# Patient Record
Sex: Female | Born: 1985 | Race: Black or African American | Hispanic: No | Marital: Single | Smoking: Never smoker
Health system: Southern US, Community
[De-identification: ages and names within clinical notes are randomized; demographics above are authoritative.]

## PROBLEM LIST (undated history)

## (undated) DIAGNOSIS — Z8759 Personal history of other complications of pregnancy, childbirth and the puerperium: Secondary | ICD-10-CM

## (undated) DIAGNOSIS — I10 Essential (primary) hypertension: Secondary | ICD-10-CM

## (undated) DIAGNOSIS — Z8742 Personal history of other diseases of the female genital tract: Secondary | ICD-10-CM

## (undated) DIAGNOSIS — R87629 Unspecified abnormal cytological findings in specimens from vagina: Secondary | ICD-10-CM

## (undated) DIAGNOSIS — B009 Herpesviral infection, unspecified: Secondary | ICD-10-CM

## (undated) HISTORY — PX: NO PAST SURGERIES: SHX2092

## (undated) HISTORY — DX: Essential (primary) hypertension: I10

## (undated) HISTORY — DX: Unspecified abnormal cytological findings in specimens from vagina: R87.629

## (undated) HISTORY — PX: CERVICAL CERCLAGE: SHX1329

---

## 2019-12-24 HISTORY — PX: CERVICAL CERCLAGE: SHX1329

## 2019-12-24 NOTE — L&D Delivery Note (Signed)
OB/GYN Faculty Practice Delivery Note  Patricia Valencia is a 34 y.o. [id number] s/p vaginal delivery at [redacted]w[redacted]d. She was admitted for preeclampsia with severe features based on headache and visual scotomas.   ROM: 2h 37m with clear fluid GBS Status: unknown (GBS culture collected on admission) Maximum Maternal Temperature: 98.38F  Labor Progress: On admission, cytotec was administered and FB was placed. Pt was then transitioned to pitocin with good fetal tolerance. AROM for clear fluid at 0330. Pt then progressed to complete cervical dilation with uncomplicated delivery as noted below.  Delivery Date/Time: 4136 on 11/08/20 Delivery: Called to room and fetal head was visible at introitus. Head delivered LOA. Loose nuchal cord present x1, reduced prior to delivery. Shoulder and body delivered in usual fashion. Infant with spontaneous cry, placed on mother's abdomen, dried and stimulated. Cord clamped x 2 after 1-minute delay, and cut by maternal grandmother under my direct supervision. Cord blood drawn. Placenta delivered spontaneously with gentle cord traction. Fundus firm with massage and Pitocin. Labia, perineum, vagina, and cervix were inspected, no evidence of lacerations.   Placenta: 3-vessel cord, intact, sent to L&D Complications: none Lacerations: none EBL: 50 ml Analgesia: IV fentanyl  Infant: female  APGARs 8 & 9  weight per medical record  Guillermina City, MD OB/GYN Fellow, Faculty Practice

## 2020-04-14 ENCOUNTER — Emergency Department (HOSPITAL_COMMUNITY): Payer: Medicaid Other

## 2020-04-14 ENCOUNTER — Emergency Department (HOSPITAL_COMMUNITY)
Admission: EM | Admit: 2020-04-14 | Discharge: 2020-04-14 | Disposition: A | Discharge status: Home / Self Care | Payer: Medicaid Other | Attending: Emergency Medicine | Admitting: Emergency Medicine

## 2020-04-14 ENCOUNTER — Encounter (HOSPITAL_COMMUNITY): Payer: Self-pay

## 2020-04-14 ENCOUNTER — Other Ambulatory Visit: Payer: Self-pay

## 2020-04-14 DIAGNOSIS — Z3A01 Less than 8 weeks gestation of pregnancy: Secondary | ICD-10-CM | POA: Insufficient documentation

## 2020-04-14 DIAGNOSIS — O468X1 Other antepartum hemorrhage, first trimester: Secondary | ICD-10-CM

## 2020-04-14 DIAGNOSIS — O418X10 Other specified disorders of amniotic fluid and membranes, first trimester, not applicable or unspecified: Secondary | ICD-10-CM

## 2020-04-14 DIAGNOSIS — O418X11 Other specified disorders of amniotic fluid and membranes, first trimester, fetus 1: Secondary | ICD-10-CM | POA: Insufficient documentation

## 2020-04-14 DIAGNOSIS — O039 Complete or unspecified spontaneous abortion without complication: Secondary | ICD-10-CM | POA: Insufficient documentation

## 2020-04-14 DIAGNOSIS — Z2913 Encounter for prophylactic Rho(D) immune globulin: Secondary | ICD-10-CM | POA: Diagnosis not present

## 2020-04-14 DIAGNOSIS — R102 Pelvic and perineal pain: Secondary | ICD-10-CM | POA: Insufficient documentation

## 2020-04-14 DIAGNOSIS — O200 Threatened abortion: Secondary | ICD-10-CM

## 2020-04-14 DIAGNOSIS — O209 Hemorrhage in early pregnancy, unspecified: Secondary | ICD-10-CM | POA: Diagnosis present

## 2020-04-14 LAB — URINALYSIS, ROUTINE W REFLEX MICROSCOPIC
Bilirubin Urine: NEGATIVE
Glucose, UA: NEGATIVE mg/dL
Ketones, ur: NEGATIVE mg/dL
Leukocytes,Ua: NEGATIVE
Nitrite: NEGATIVE
Protein, ur: NEGATIVE mg/dL
RBC / HPF: >50 RBC/hpf — ABNORMAL HIGH (ref 0–5)
Specific Gravity, Urine: 1.013 (ref 1.005–1.030)
pH: 6 (ref 5.0–8.0)

## 2020-04-14 LAB — COMPREHENSIVE METABOLIC PANEL WITH GFR
ALT: 17 U/L (ref 0–44)
AST: 15 U/L (ref 15–41)
Albumin: 3.5 g/dL (ref 3.5–5.0)
Alkaline Phosphatase: 39 U/L (ref 38–126)
Anion gap: 7 (ref 5–15)
BUN: 13 mg/dL (ref 6–20)
CO2: 21 mmol/L — ABNORMAL LOW (ref 22–32)
Calcium: 8.3 mg/dL — ABNORMAL LOW (ref 8.9–10.3)
Chloride: 108 mmol/L (ref 98–111)
Creatinine, Ser: 0.76 mg/dL (ref 0.44–1.00)
GFR calc Af Amer: >60 mL/min
GFR calc non Af Amer: >60 mL/min
Glucose, Bld: 103 mg/dL — ABNORMAL HIGH (ref 70–99)
Potassium: 3.8 mmol/L (ref 3.5–5.1)
Sodium: 136 mmol/L (ref 135–145)
Total Bilirubin: 0.7 mg/dL (ref 0.3–1.2)
Total Protein: 6.5 g/dL (ref 6.5–8.1)

## 2020-04-14 LAB — ABO/RH: ABO/RH(D): A NEG

## 2020-04-14 LAB — CBC WITH DIFFERENTIAL/PLATELET
Abs Immature Granulocytes: 0.04 10*3/uL (ref 0.00–0.07)
Basophils Absolute: 0.1 10*3/uL (ref 0.0–0.1)
Basophils Relative: 1 %
Eosinophils Absolute: 0.4 10*3/uL (ref 0.0–0.5)
Eosinophils Relative: 5 %
HCT: 39.3 % (ref 36.0–46.0)
Hemoglobin: 12.6 g/dL (ref 12.0–15.0)
Immature Granulocytes: 0 %
Lymphocytes Relative: 20 %
Lymphs Abs: 1.9 10*3/uL (ref 0.7–4.0)
MCH: 27.6 pg (ref 26.0–34.0)
MCHC: 32.1 g/dL (ref 30.0–36.0)
MCV: 86 fL (ref 80.0–100.0)
Monocytes Absolute: 0.9 10*3/uL (ref 0.1–1.0)
Monocytes Relative: 9 %
Neutro Abs: 6.3 10*3/uL (ref 1.7–7.7)
Neutrophils Relative %: 65 %
Platelets: 125 10*3/uL — ABNORMAL LOW (ref 150–400)
RBC: 4.57 MIL/uL (ref 3.87–5.11)
RDW: 13 % (ref 11.5–15.5)
WBC: 9.6 10*3/uL (ref 4.0–10.5)
nRBC: 0 % (ref 0.0–0.2)

## 2020-04-14 LAB — WET PREP, GENITAL
Clue Cells Wet Prep HPF POC: NONE SEEN
Sperm: NONE SEEN
Trich, Wet Prep: NONE SEEN
Yeast Wet Prep HPF POC: NONE SEEN

## 2020-04-14 LAB — HIV ANTIBODY (ROUTINE TESTING W REFLEX): HIV Screen 4th Generation wRfx: NONREACTIVE

## 2020-04-14 LAB — RPR: RPR Ser Ql: NONREACTIVE

## 2020-04-14 LAB — HCG, QUANTITATIVE, PREGNANCY: hCG, Beta Chain, Quant, S: 25577 m[IU]/mL — ABNORMAL HIGH

## 2020-04-14 LAB — LIPASE, BLOOD: Lipase: 38 U/L (ref 11–51)

## 2020-04-14 MED ORDER — MORPHINE SULFATE (PF) 4 MG/ML IV SOLN
4.0000 mg | Freq: Once | INTRAVENOUS | Status: AC
  Administered 2020-04-14: 4 mg via INTRAVENOUS
  Filled 2020-04-14: qty 1

## 2020-04-14 MED ORDER — RHO D IMMUNE GLOBULIN 1500 UNIT/2ML IJ SOSY
300.0000 ug | PREFILLED_SYRINGE | Freq: Once | INTRAMUSCULAR | Status: AC
  Administered 2020-04-14: 300 ug via INTRAMUSCULAR
  Filled 2020-04-14: qty 2

## 2020-04-14 NOTE — ED Notes (Signed)
Blood bank said they had rhogan ready for this patient. Notified Lynnsey,RN.

## 2020-04-14 NOTE — ED Triage Notes (Signed)
Pt presents with c/o vaginal bleeding that started this morning. Pt had a positive pregnancy test on 4/20, is unsure of the exact date of her last period but believes it to be around 02/18/20. Pt reports she has been passing some clots this morning. Pt is tearful during the triage.

## 2020-04-14 NOTE — ED Provider Notes (Signed)
[place] DEPT Provider Note   CSN: [id number] Arrival date & time: 04/14/20  [id number]     History Chief Complaint  Patient presents with  . Vaginal Bleeding    Patricia Valencia is a 34 y.o. female.  The history is provided by the patient. No language interpreter was used.     34 year old female currently approximately [redacted] weeks pregnant presenting complaining of vaginal bleeding.  Patient reports she went to use the bathroom this morning and noticed bleeding,, transvaginal.  She also noted some clots.  She also endorsed feeling crampy sensation to her low abdomen and pelvic region.  Pain has been persistent, 6 out of 10, improves with heating pad.  She report last menstrual period was 02/18/2020.  She has had a positive pregnancy test at home.  She reports she is concerned about potential miscarriage.  She denies any increased activities or heavy lifting or anything that may contribute to this.  She felt that she has been undergoing through a lot of stress as she recently lost her baby's father recently prior to the discovery of her pregnancy.  She is a G4, P3 with 3 children.  She has not had a formal ultrasound.   No past medical history on file.  There are no problems to display for this patient.    The histories are not reviewed yet. Please review them in the "History" navigator section and refresh this [place].   OB History   No obstetric history on file.     No family history on file.  Social History   Tobacco Use  . Smoking status: Not on file  Substance Use Topics  . Alcohol use: Not on file  . Drug use: Not on file    Home Medications Prior to Admission medications   Not on File    Allergies    Patient has no allergy information on record.  Review of Systems   Review of Systems  All other systems reviewed and are negative.   Physical Exam Updated Vital Signs BP 126/90   Pulse 79   Temp 98.9 F (37.2 C) (Oral)   Resp  15   LMP 02/18/2020 (Approximate)   SpO2 100%   Physical Exam Vitals and nursing note reviewed.  Constitutional:      General: She is not in acute distress.    Appearance: She is well-developed.     Comments: Patient is tearful.  HENT:     Head: Atraumatic.  Eyes:     Conjunctiva/sclera: Conjunctivae normal.  Cardiovascular:     Rate and Rhythm: Normal rate and regular rhythm.     Heart sounds: Normal heart sounds.  Pulmonary:     Effort: Pulmonary effort is normal.     Breath sounds: Normal breath sounds.  Abdominal:     Palpations: Abdomen is soft.     Tenderness: There is no abdominal tenderness.  Genitourinary:    Comments: Chaperone present during exam.  No inguinal lymphadenopathy or inguinal hernia noted.  Normal external genitalia.  Discomfort with speculum insertion.  Copious amount of blood noted in vaginal vault with possible products of conception.  Cervical os is closed.  On bimanual examination, left adnexal tenderness.  No cervical motion tenderness. Musculoskeletal:     Cervical back: Neck supple.  Skin:    Findings: No rash.  Neurological:     Mental Status: She is alert.     ED Results / Procedures / Treatments   Labs (all labs ordered are  listed, but only abnormal results are displayed) Labs Reviewed  WET PREP, GENITAL - Abnormal; Notable for the following components:      Result Value   WBC, Wet Prep HPF POC FEW (*)    All other components within normal limits  CBC WITH DIFFERENTIAL/PLATELET - Abnormal; Notable for the following components:   Platelets 125 (*)    All other components within normal limits  COMPREHENSIVE METABOLIC PANEL - Abnormal; Notable for the following components:   CO2 21 (*)    Glucose, Bld 103 (*)    Calcium 8.3 (*)    All other components within normal limits  URINALYSIS, ROUTINE W REFLEX MICROSCOPIC - Abnormal; Notable for the following components:   Hgb urine dipstick LARGE (*)    RBC / HPF >50 (*)    Bacteria, UA RARE  (*)    All other components within normal limits  HCG, QUANTITATIVE, PREGNANCY - Abnormal; Notable for the following components:   hCG, Beta Chain, Quant, S 25,577 (*)    All other components within normal limits  LIPASE, BLOOD  RPR  HIV ANTIBODY (ROUTINE TESTING W REFLEX)  ABO/RH  RH IG WORKUP (INCLUDES ABO/RH)  GC/CHLAMYDIA PROBE AMP ([place]) NOT AT [facility]    EKG None  Radiology US OB Comp < 14 Wks  Result Date: 04/14/2020 CLINICAL DATA:  Pregnant patient with vaginal bleeding. EXAM: OBSTETRIC <14 WK [place] AND TRANSVAGINAL OB US TECHNIQUE: Both transabdominal and transvaginal ultrasound examinations were performed for complete evaluation of the gestation as well as the maternal uterus, adnexal regions, and pelvic cul-de-sac. Transvaginal technique was performed to assess early pregnancy. COMPARISON:  None. FINDINGS: Intrauterine gestational sac: Single visualized. Yolk sac:  Visualized. Embryo:  Visualized. Cardiac Activity: Detected. Heart Rate: 89 bpm CRL:  2.2 mm   5 w   5 d                  [place] EDC: 12/10/2020. Subchorionic hemorrhage: Moderately large to large. Maternal uterus/adnexae: Corpus luteum cyst on the right noted. Left ovary not visualized. Small volume of free pelvic fluid is noted. IMPRESSION: Single living intrauterine pregnancy. Moderately large to large subchorionic hemorrhage. Electronically Signed   By: Inge Rise M.D.   On: 04/14/2020 09:50   US OB Transvaginal  Result Date: 04/14/2020 CLINICAL DATA:  Pregnant patient with vaginal bleeding. EXAM: OBSTETRIC <14 WK [place] AND TRANSVAGINAL OB US TECHNIQUE: Both transabdominal and transvaginal ultrasound examinations were performed for complete evaluation of the gestation as well as the maternal uterus, adnexal regions, and pelvic cul-de-sac. Transvaginal technique was performed to assess early pregnancy. COMPARISON:  None. FINDINGS: Intrauterine gestational sac: Single visualized. Yolk sac:  Visualized. Embryo:   Visualized. Cardiac Activity: Detected. Heart Rate: 89 bpm CRL:  2.2 mm   5 w   5 d                  [place] EDC: 12/10/2020. Subchorionic hemorrhage: Moderately large to large. Maternal uterus/adnexae: Corpus luteum cyst on the right noted. Left ovary not visualized. Small volume of free pelvic fluid is noted. IMPRESSION: Single living intrauterine pregnancy. Moderately large to large subchorionic hemorrhage. Electronically Signed   By: Inge Rise M.D.   On: 04/14/2020 09:50    Procedures Procedures (including critical care time)  Medications Ordered in ED Medications  morphine 4 MG/ML injection 4 mg (4 mg Intravenous Given 04/14/20 0919)  rho (d) immune globulin (RHIG/RHOPHYLAC) injection 300 mcg (300 mcg Intramuscular Given 04/14/20 0950)  ED Course  I have reviewed the triage vital signs and the nursing notes.  Pertinent labs & imaging results that were available during my care of the patient were reviewed by me and considered in my medical decision making (see chart for details).    MDM Rules/Calculators/A&P                      BP 123/83 (BP Location: Left Arm)   Pulse 81   Temp 98.9 F (37.2 C) (Oral)   Resp 18   LMP 02/18/2020 (Approximate)   SpO2 98%   Final Clinical Impression(s) / ED Diagnoses Final diagnoses:  Threatened miscarriage  Subchorionic hemorrhage of placenta in first trimester, single or unspecified fetus    Rx / DC Orders ED Discharge Orders    None     8:11 AM Patient here with vaginal bleeding in first trimester.  Findings concerning for threatened miscarriage versus active spontaneous abortion.  Work-up initiated.  8:40 AM Pt is Rh negative, will start RhoGam.   10:08 AM UA without signs of urinary tract infection, wet prep unremarkable, labs are reassuring, beta-hCG is 25,000, transvaginal ultrasound demonstrating a single living intrauterine pregnancy estimated to be 5 weeks and 5 days with moderate to large subchorionic hemorrhage.  At  this time, patient will be discharged home to follow-up closely with OB for further management of her pregnancy.  Encourage patient to avoid heavy lifting or strength activities in the meantime.   Domenic Moras, PA-C 04/14/20 1013    Veryl Speak, MD 04/14/20 [phone]

## 2020-04-15 LAB — RH IG WORKUP (INCLUDES ABO/RH)
ABO/RH(D): A NEG
Antibody Screen: NEGATIVE
Gestational Age(Wks): 7
Unit division: 0
Unit tag comment: 7

## 2020-04-17 LAB — GC/CHLAMYDIA PROBE AMP (~~LOC~~) NOT AT ARMC
Chlamydia: NEGATIVE
Comment: NEGATIVE
Comment: NORMAL
Neisseria Gonorrhea: NEGATIVE

## 2020-04-23 ENCOUNTER — Emergency Department (HOSPITAL_COMMUNITY): Payer: Medicaid Other

## 2020-04-23 ENCOUNTER — Emergency Department (HOSPITAL_COMMUNITY)
Admission: EM | Admit: 2020-04-23 | Discharge: 2020-04-25 | Disposition: A | Discharge status: Other Acute Inpatient Hospital | Payer: Medicaid Other | Attending: Emergency Medicine | Admitting: Emergency Medicine

## 2020-04-23 ENCOUNTER — Encounter (HOSPITAL_COMMUNITY): Payer: Self-pay | Admitting: *Deleted

## 2020-04-23 ENCOUNTER — Other Ambulatory Visit: Payer: Self-pay

## 2020-04-23 DIAGNOSIS — T50992A Poisoning by other drugs, medicaments and biological substances, intentional self-harm, initial encounter: Secondary | ICD-10-CM | POA: Insufficient documentation

## 2020-04-23 DIAGNOSIS — F322 Major depressive disorder, single episode, severe without psychotic features: Secondary | ICD-10-CM | POA: Diagnosis not present

## 2020-04-23 DIAGNOSIS — O9A211 Injury, poisoning and certain other consequences of external causes complicating pregnancy, first trimester: Secondary | ICD-10-CM | POA: Diagnosis present

## 2020-04-23 DIAGNOSIS — Z79899 Other long term (current) drug therapy: Secondary | ICD-10-CM | POA: Insufficient documentation

## 2020-04-23 DIAGNOSIS — Z3491 Encounter for supervision of normal pregnancy, unspecified, first trimester: Secondary | ICD-10-CM

## 2020-04-23 DIAGNOSIS — Z20822 Contact with and (suspected) exposure to covid-19: Secondary | ICD-10-CM | POA: Diagnosis not present

## 2020-04-23 DIAGNOSIS — R112 Nausea with vomiting, unspecified: Secondary | ICD-10-CM | POA: Insufficient documentation

## 2020-04-23 DIAGNOSIS — N939 Abnormal uterine and vaginal bleeding, unspecified: Secondary | ICD-10-CM

## 2020-04-23 DIAGNOSIS — T50902A Poisoning by unspecified drugs, medicaments and biological substances, intentional self-harm, initial encounter: Secondary | ICD-10-CM

## 2020-04-23 LAB — CBC
HCT: 34 % — ABNORMAL LOW (ref 36.0–46.0)
Hemoglobin: 11.3 g/dL — ABNORMAL LOW (ref 12.0–15.0)
MCH: 28.1 pg (ref 26.0–34.0)
MCHC: 33.2 g/dL (ref 30.0–36.0)
MCV: 84.6 fL (ref 80.0–100.0)
Platelets: 132 10*3/uL — ABNORMAL LOW (ref 150–400)
RBC: 4.02 MIL/uL (ref 3.87–5.11)
RDW: 12.9 % (ref 11.5–15.5)
WBC: 10.8 10*3/uL — ABNORMAL HIGH (ref 4.0–10.5)
nRBC: 0 % (ref 0.0–0.2)

## 2020-04-23 LAB — RAPID URINE DRUG SCREEN, HOSP PERFORMED
Amphetamines: NOT DETECTED
Barbiturates: NOT DETECTED
Benzodiazepines: NOT DETECTED
Cocaine: NOT DETECTED
Opiates: NOT DETECTED
Tetrahydrocannabinol: NOT DETECTED

## 2020-04-23 LAB — COMPREHENSIVE METABOLIC PANEL
ALT: 15 U/L (ref 0–44)
AST: 20 U/L (ref 15–41)
Albumin: 3.5 g/dL (ref 3.5–5.0)
Alkaline Phosphatase: 39 U/L (ref 38–126)
Anion gap: 10 (ref 5–15)
BUN: <5 mg/dL — ABNORMAL LOW (ref 6–20)
CO2: 22 mmol/L (ref 22–32)
Calcium: 8.9 mg/dL (ref 8.9–10.3)
Chloride: 104 mmol/L (ref 98–111)
Creatinine, Ser: 0.87 mg/dL (ref 0.44–1.00)
GFR calc Af Amer: >60 mL/min (ref >60)
GFR calc non Af Amer: >60 mL/min (ref >60)
Glucose, Bld: 104 mg/dL — ABNORMAL HIGH (ref 70–99)
Potassium: 3.7 mmol/L (ref 3.5–5.1)
Sodium: 136 mmol/L (ref 135–145)
Total Bilirubin: 0.5 mg/dL (ref 0.3–1.2)
Total Protein: 6.5 g/dL (ref 6.5–8.1)

## 2020-04-23 LAB — ETHANOL: Alcohol, Ethyl (B): <10 mg/dL (ref <10)

## 2020-04-23 LAB — ACETAMINOPHEN LEVEL: Acetaminophen (Tylenol), Serum: <10 ug/mL — ABNORMAL LOW (ref 10–30)

## 2020-04-23 LAB — HCG, QUANTITATIVE, PREGNANCY: hCG, Beta Chain, Quant, S: 71618 m[IU]/mL — ABNORMAL HIGH (ref <5)

## 2020-04-23 LAB — SALICYLATE LEVEL: Salicylate Lvl: <7 mg/dL — ABNORMAL LOW (ref 7.0–30.0)

## 2020-04-23 MED ORDER — RHO D IMMUNE GLOBULIN 1500 UNIT/2ML IJ SOSY: 300.0000 ug | PREFILLED_SYRINGE | Freq: Once | INTRAMUSCULAR | Status: DC

## 2020-04-23 NOTE — BH Assessment (Signed)
Tele Assessment Note   Patient Name: Patricia Valencia MRN: [id number] Referring Physician: Lajean Saver, MD Location of Patient: [facility] Location of Provider: [place] is an 34 y.o. female who presents to the ED voluntarily. Pt reports she intentionally ingested 15-25 various pills. Pt denies that this was a suicide attempt. Pt states she did this because she was feeling overwhelmed and wanted to rest. Pt states she is aware that ingesting that much medication could hurt her. Pt is [redacted] weeks pregnant. Pt denies that she was trying to hurt herself or her baby but admits she knew that it was possible to cause harm to herself by taking the pills. Pt states she has had multiple stressors including her boyfriend dying 2 weeks ago, work stress, being behind on bills, her other 3 children, and finding out that she is pregnant. Pt denies SI, HI, and AVH. Pt states she has been admitted to an inpt facility 10 years ago but does not disclose the reason. Pt states she does not want to talk about it. Pt states she does not have a current [place] provider but reports she saw a therapist "years ago" for depression.   TTS spoke with the pt's sister, Patricia Valencia with the pt's consent. Pt's sister reports the pt told her she was feeling stressed and "acted impulsively, without thinking." Pt's sister reports she lives in the same apartment complex with her and she has no concerns that she will try to harm herself. Pt's sister reports the pt "has been dealing with a lot" and she states she will be able to keep her safe if she is d/c. Pt's sister states "we all have snap judgment at times."  TTS consulted with Lindon Romp, NP who recommends inpt tx. TTS to seek placement. Pt's nurse Chrisco, Altha Harm, RN has been advised.  Diagnosis: MDD, single episode, severe, w/o psychosis  Past Medical History: History reviewed. No pertinent past medical history.  History reviewed. No pertinent  surgical history.  Family History: History reviewed. No pertinent family history.  Social History:  reports that she has never smoked. She has never used smokeless tobacco. She reports that she does not drink alcohol. No history on file for drug.  Additional Social History:  Alcohol / Drug Use Pain Medications: See MAR Prescriptions: See MAR Over the Counter: See MAR History of alcohol / drug use?: No history of alcohol / drug abuse  CIWA: CIWA-Ar BP: 122/87 Pulse Rate: 86 COWS:    Allergies:  Allergies  Allergen Reactions  . Sulfa Antibiotics Swelling    Facial swelling    Home Medications: (Not in a hospital admission)   OB/GYN Status:  Patient's last menstrual period was 02/18/2020 (approximate).  General Assessment Data Assessment unable to be completed: Yes Reason for not completing assessment: TTS calling cart, no answer Location of Assessment: [facility] ED TTS Assessment: In system Is this a Tele or Face-to-Face Assessment?: Tele Assessment Is this an Initial Assessment or a Re-assessment for this encounter?: Initial Assessment Patient Accompanied by:: N/A Language Other than English: No Living Arrangements: Other (Comment) What gender do you identify as?: Female Marital status: Single Pregnancy Status: Yes (Comment: include estimated delivery date)(approx 8-9 weeks) Living Arrangements: Children Can pt return to current living arrangement?: Yes Admission Status: Voluntary Is patient capable of signing voluntary admission?: Yes Referral Source: Self/Family/Friend Insurance type: MCD     Crisis Care Plan Living Arrangements: Children Name of Psychiatrist: none Name of Therapist: none  Education Status  Is patient currently in school?: No Is the patient employed, unemployed or receiving disability?: Employed  Risk to self with the past 6 months Suicidal Ideation: No-Not Currently/Within Last 6 Months Has patient been a risk to self within the past 6 months  prior to admission? : Yes Suicidal Intent: No Has patient had any suicidal intent within the past 6 months prior to admission? : No Is patient at risk for suicide?: Yes Suicidal Plan?: No Has patient had any suicidal plan within the past 6 months prior to admission? : No Access to Means: No What has been your use of drugs/alcohol within the last 12 months?: denies Previous Attempts/Gestures: No Triggers for Past Attempts: None known Intentional Self Injurious Behavior: None Family Suicide History: No Recent stressful life event(s): Loss (Comment), Other (Comment), Financial Problems(work stress, boyfriend died) Persecutory voices/beliefs?: No Depression: Yes Depression Symptoms: Despondent, Insomnia, Tearfulness, Loss of interest in usual pleasures, Feeling worthless/self pity Substance abuse history and/or treatment for substance abuse?: No Suicide prevention information given to non-admitted patients: Not applicable  Risk to Others within the past 6 months Homicidal Ideation: No Does patient have any lifetime risk of violence toward others beyond the six months prior to admission? : No Thoughts of Harm to Others: No Current Homicidal Intent: No Current Homicidal Plan: No Access to Homicidal Means: No History of harm to others?: No Assessment of Violence: None Noted Does patient have access to weapons?: No Criminal Charges Pending?: No Does patient have a court date: No Is patient on probation?: No  Psychosis Hallucinations: None noted Delusions: None noted  Mental Status Report Appearance/Hygiene: Unremarkable Eye Contact: Good Motor Activity: Freedom of movement Speech: Soft Level of Consciousness: Quiet/awake Mood: Depressed, Sad, Helpless Affect: Depressed, Flat Anxiety Level: None Thought Processes: Coherent, Relevant Judgement: Impaired Orientation: Person, Time, Place, Situation, Appropriate for developmental age Obsessive Compulsive Thoughts/Behaviors:  None  Cognitive Functioning Concentration: Normal Memory: Remote Intact, Recent Intact Is patient IDD: No Insight: Poor Impulse Control: Poor Appetite: Poor Have you had any weight changes? : No Change Sleep: Decreased Total Hours of Sleep: 4 Vegetative Symptoms: None  ADLScreening [facility] Assessment Services) Patient's cognitive ability adequate to safely complete daily activities?: Yes Patient able to express need for assistance with ADLs?: Yes Independently performs ADLs?: Yes (appropriate for developmental age)  Prior Inpatient Therapy Prior Inpatient Therapy: Yes Prior Therapy Dates: 2011 Prior Therapy Facilty/Provider(s): unk Reason for Treatment: pt does not disclose  Prior Outpatient Therapy Prior Outpatient Therapy: Yes Prior Therapy Dates: "years ago" Prior Therapy Facilty/Provider(s): unk Reason for Treatment: depression Does patient have an ACCT team?: No Does patient have Intensive In-House Services?  : No Does patient have [facility] services? : No Does patient have P4CC services?: No  ADL Screening (condition at time of admission) Patient's cognitive ability adequate to safely complete daily activities?: Yes Is the patient deaf or have difficulty hearing?: No Does the patient have difficulty seeing, even when wearing glasses/contacts?: No Does the patient have difficulty concentrating, remembering, or making decisions?: No Patient able to express need for assistance with ADLs?: Yes Does the patient have difficulty dressing or bathing?: No Independently performs ADLs?: Yes (appropriate for developmental age) Does the patient have difficulty walking or climbing stairs?: No Weakness of Legs: None Weakness of Arms/Hands: None  Home Assistive Devices/Equipment Home Assistive Devices/Equipment: None    Abuse/Neglect Assessment (Assessment to be complete while patient is alone) Abuse/Neglect Assessment Can Be Completed: Yes Physical Abuse: Denies Verbal Abuse:  Denies Sexual Abuse: Denies Exploitation of  patient/patient's resources: Denies Self-Neglect: Denies     Regulatory affairs officer (For Healthcare) Does Patient Have a Medical Advance Directive?: No Would patient like information on creating a medical advance directive?: No - Patient declined          Disposition: TTS consulted with Lindon Romp, NP who recommends inpt tx. TTS to seek placement. Pt's nurse Chrisco, Altha Harm, RN has been advised. Disposition Initial Assessment Completed for this Encounter: Yes Disposition of Patient: Admit Type of inpatient treatment program: Adult Patient refused recommended treatment: No  This service was provided via telemedicine using a 2-way, interactive audio and video technology.  Names of all persons participating in this telemedicine service and their role in this encounter. Name: Patricia Valencia Role: Patient  Name: Patricia Valencia Role: Sister  Name: Lind Covert, [place] Role: TTS  Name: Lindon Romp, NP Role: [facility] provider    Lyanne Co 04/23/2020 10:33 PM

## 2020-04-23 NOTE — Progress Notes (Signed)
TTS consulted with Lindon Romp, NP who recommends inpt tx. TTS to seek placement. Pt's nurse Chrisco, Altha Harm, RN has been advised.    TTS attempted to advise the EDP but  Altha Harm, RN could not locate them. TTS called main # [phone] and requested to speak with the EDP to provide the disposition but was connected to a line that went to v/m.

## 2020-04-23 NOTE — ED Provider Notes (Addendum)
[facility] EMERGENCY DEPARTMENT Provider Note   CSN: [id number] Arrival date & time: 04/23/20  1702     History Chief Complaint  Patient presents with  . Ingestion    Patricia Valencia is a 34 y.o. female.  Patient presents s/p ingestion ~15-25 ibuprofen/pm tablets around 3 pm today. Symptoms acute onset, mod-severe, episodic. Denies other ingestion. Patient indicated was under a lot of stress lately, and was also stressed by recent possible miscarriage. Patient with recent ED visit 1 week ago with vaginal bleeding - had ~ 6 week pregnancy on u/s then. Since then did have mild cramping, and bleeding - although notes no abd or pelvic pain. Mild bleeding persists, but improved from earlier visit. No faintness or dizziness. Post ingestion, onset of several episodes nv, but ems notes no pills/pill fragment in emesis. Notes cbg normal. bp normal.   The history is provided by the patient and the EMS personnel. The history is limited by the condition of the patient.       History reviewed. No pertinent past medical history.  There are no problems to display for this patient.   History reviewed. No pertinent surgical history.   OB History    Gravida  1   Para      Term      Preterm      AB      Living        SAB      TAB      Ectopic      Multiple      Live Births              History reviewed. No pertinent family history.  Social History   Tobacco Use  . Smoking status: Never Smoker  . Smokeless tobacco: Never Used  Substance Use Topics  . Alcohol use: Never  . Drug use: Not on file    Home Medications Prior to Admission medications   Medication Sig Start Date End Date Taking? Authorizing Provider  diphenhydramine-acetaminophen (TYLENOL PM) 25-500 MG TABS tablet Take 2 tablets by mouth at bedtime as needed (pain/sleep).   Yes [provider]  Ibuprofen-diphenhydrAMINE Cit (ADVIL PM PO) Take 25 tablets by mouth once.   Yes  [provider]  Multiple Vitamin (MULTIVITAMIN WITH MINERALS) TABS tablet Take 1 tablet by mouth daily.   Yes [provider]    Allergies    Sulfa antibiotics  Review of Systems   Review of Systems  Constitutional: Negative for fever.  HENT: Negative for sore throat.   Eyes: Negative for redness.  Respiratory: Negative for shortness of breath.   Cardiovascular: Negative for chest pain.  Gastrointestinal: Positive for nausea and vomiting. Negative for abdominal pain.  Genitourinary: Negative for dysuria and flank pain.       Recent vaginal bleeding/cramping.  Musculoskeletal: Negative for back pain.  Skin: Negative for rash.  Neurological: Negative for headaches.  Hematological: Does not bruise/bleed easily.  Psychiatric/Behavioral: Negative for confusion.    Physical Exam Updated Vital Signs BP 100/69   Pulse 86   Temp 99.3 F (37.4 C) (Oral)   Resp 19   Ht 1.6 m (5\' 3" )   Wt 71.2 kg   LMP 02/18/2020 (Approximate)   SpO2 100%   BMI 27.81 kg/m   Physical Exam Vitals and nursing note reviewed.  Constitutional:      Appearance: Normal appearance. She is well-developed.  HENT:     Head: Atraumatic.     Nose:  Nose normal.     Mouth/Throat:     Mouth: Mucous membranes are moist.  Eyes:     General: No scleral icterus.    Conjunctiva/sclera: Conjunctivae normal.     Pupils: Pupils are equal, round, and reactive to light.  Neck:     Trachea: No tracheal deviation.  Cardiovascular:     Rate and Rhythm: Normal rate and regular rhythm.     Pulses: Normal pulses.     Heart sounds: Normal heart sounds. No murmur. No friction rub. No gallop.   Pulmonary:     Effort: Pulmonary effort is normal. No respiratory distress.     Breath sounds: Normal breath sounds.  Abdominal:     General: Bowel sounds are normal. There is no distension.     Palpations: Abdomen is soft.     Tenderness: There is no abdominal tenderness. There is no guarding.    Genitourinary:    Comments: No cva tenderness. No active bleeding from cervix, small amount blood in vagina, no tissue. Cervix closed.  Musculoskeletal:        General: No swelling.     Cervical back: Normal range of motion and neck supple. No rigidity. No muscular tenderness.  Skin:    General: Skin is warm and dry.     Findings: No rash.  Neurological:     Mental Status: She is alert.     Comments: Alert, speech normal.   Psychiatric:        Mood and Affect: Mood normal.     ED Results / Procedures / Treatments   Labs (all labs ordered are listed, but only abnormal results are displayed) Results for orders placed or performed during the hospital encounter of 04/23/20  Comprehensive metabolic panel  Result Value Ref Range   Sodium 136 135 - 145 mmol/L   Potassium 3.7 3.5 - 5.1 mmol/L   Chloride 104 98 - 111 mmol/L   CO2 22 22 - 32 mmol/L   Glucose, Bld 104 (H) 70 - 99 mg/dL   BUN <5 (L) 6 - 20 mg/dL   Creatinine, Ser 0.87 0.44 - 1.00 mg/dL   Calcium 8.9 8.9 - 10.3 mg/dL   Total Protein 6.5 6.5 - 8.1 g/dL   Albumin 3.5 3.5 - 5.0 g/dL   AST 20 15 - 41 U/L   ALT 15 0 - 44 U/L   Alkaline Phosphatase 39 38 - 126 U/L   Total Bilirubin 0.5 0.3 - 1.2 mg/dL   GFR calc non Af Amer >60 >60 mL/min   GFR calc Af Amer >60 >60 mL/min   Anion gap 10 5 - 15  CBC  Result Value Ref Range   WBC 10.8 (H) 4.0 - 10.5 K/uL   RBC 4.02 3.87 - 5.11 MIL/uL   Hemoglobin 11.3 (L) 12.0 - 15.0 g/dL   HCT 34.0 (L) 36.0 - 46.0 %   MCV 84.6 80.0 - 100.0 fL   MCH 28.1 26.0 - 34.0 pg   MCHC 33.2 30.0 - 36.0 g/dL   RDW 12.9 11.5 - 15.5 %   Platelets 132 (L) 150 - 400 K/uL   nRBC 0.0 0.0 - 0.2 %  Ethanol  Result Value Ref Range   Alcohol, Ethyl (B) <10 <10 mg/dL  Acetaminophen level  Result Value Ref Range   Acetaminophen (Tylenol), Serum <10 (L) 10 - 30 ug/mL  Salicylate level  Result Value Ref Range   Salicylate Lvl Q000111Q (L) 7.0 - 30.0 mg/dL  Rapid urine drug screen (hospital  performed)   Result Value Ref Range   Opiates NONE DETECTED NONE DETECTED   Cocaine NONE DETECTED NONE DETECTED   Benzodiazepines NONE DETECTED NONE DETECTED   Amphetamines NONE DETECTED NONE DETECTED   Tetrahydrocannabinol NONE DETECTED NONE DETECTED   Barbiturates NONE DETECTED NONE DETECTED  hCG, quantitative, pregnancy  Result Value Ref Range   hCG, Beta Chain, Quant, S 71,618 (H) <5 mIU/mL   US OB Comp < 14 Wks  Result Date: 04/14/2020 CLINICAL DATA:  Pregnant patient with vaginal bleeding. EXAM: OBSTETRIC <14 WK [place] AND TRANSVAGINAL OB US TECHNIQUE: Both transabdominal and transvaginal ultrasound examinations were performed for complete evaluation of the gestation as well as the maternal uterus, adnexal regions, and pelvic cul-de-sac. Transvaginal technique was performed to assess early pregnancy. COMPARISON:  None. FINDINGS: Intrauterine gestational sac: Single visualized. Yolk sac:  Visualized. Embryo:  Visualized. Cardiac Activity: Detected. Heart Rate: 89 bpm CRL:  2.2 mm   5 w   5 d                  [place] EDC: 12/10/2020. Subchorionic hemorrhage: Moderately large to large. Maternal uterus/adnexae: Corpus luteum cyst on the right noted. Left ovary not visualized. Small volume of free pelvic fluid is noted. IMPRESSION: Single living intrauterine pregnancy. Moderately large to large subchorionic hemorrhage. Electronically Signed   By: Inge Rise M.D.   On: 04/14/2020 09:50   US OB Transvaginal  Result Date: 04/14/2020 CLINICAL DATA:  Pregnant patient with vaginal bleeding. EXAM: OBSTETRIC <14 WK [place] AND TRANSVAGINAL OB US TECHNIQUE: Both transabdominal and transvaginal ultrasound examinations were performed for complete evaluation of the gestation as well as the maternal uterus, adnexal regions, and pelvic cul-de-sac. Transvaginal technique was performed to assess early pregnancy. COMPARISON:  None. FINDINGS: Intrauterine gestational sac: Single visualized. Yolk sac:  Visualized. Embryo:  Visualized.  Cardiac Activity: Detected. Heart Rate: 89 bpm CRL:  2.2 mm   5 w   5 d                  [place] EDC: 12/10/2020. Subchorionic hemorrhage: Moderately large to large. Maternal uterus/adnexae: Corpus luteum cyst on the right noted. Left ovary not visualized. Small volume of free pelvic fluid is noted. IMPRESSION: Single living intrauterine pregnancy. Moderately large to large subchorionic hemorrhage. Electronically Signed   By: Inge Rise M.D.   On: 04/14/2020 09:50   US OB LESS THAN 14 WEEKS WITH OB TRANSVAGINAL  Result Date: 04/23/2020 CLINICAL DATA:  Pregnant patient in first-trimester pregnancy with vaginal bleeding. EXAM: OBSTETRIC <14 WK [place] AND TRANSVAGINAL OB US TECHNIQUE: Both transabdominal and transvaginal ultrasound examinations were performed for complete evaluation of the gestation as well as the maternal uterus, adnexal regions, and pelvic cul-de-sac. Transvaginal technique was performed to assess early pregnancy. COMPARISON:  Obstetric ultrasound 04/14/2020 FINDINGS: Intrauterine gestational sac: Single Yolk sac:  Visualized. Embryo:  Visualized. Cardiac Activity: Visualized. Heart Rate: 133 bpm CRL:  9.1 mm   6 w   6 d                  [place] EDC: 12/11/2020 Subchorionic hemorrhage: The previous subchorionic hemorrhage noted adjacent to the gestational sac is not seen, there are no blood products in the lower uterine segment. Maternal uterus/adnexae: Avascular blood products in the lower endocervical canal in the lower uterine segment. There is a corpus luteal cyst in the right ovary, as seen on prior exam. Left ovary appears normal. IMPRESSION: 1. Single live intrauterine pregnancy estimated gestational age  [redacted] weeks 6 days based on crown-rump length for ultrasound [facility] 12/11/2020. There is been interval fetal growth since prior ultrasound. 2. The previous subchorionic hemorrhage is no longer seen about the gestational sac, there heterogeneous blood products in the endocervical canal in the lower uterine  segment. Electronically Signed   By: Keith Rake M.D.   On: 04/23/2020 20:52    EKG EKG Interpretation  Date/Time:  Sunday Apr 23 2020 17:08:43 EDT Ventricular Rate:  110 PR Interval:    QRS Duration: 63 QT Interval:  314 QTC Calculation: 425 R Axis:   39 Text Interpretation: Sinus tachycardia Nonspecific T wave abnormality No previous tracing Confirmed by Lajean Saver [phone]) on 04/23/2020 5:15:09 PM   Radiology US OB LESS THAN 14 WEEKS WITH OB TRANSVAGINAL  Result Date: 04/23/2020 CLINICAL DATA:  Pregnant patient in first-trimester pregnancy with vaginal bleeding. EXAM: OBSTETRIC <14 WK [place] AND TRANSVAGINAL OB US TECHNIQUE: Both transabdominal and transvaginal ultrasound examinations were performed for complete evaluation of the gestation as well as the maternal uterus, adnexal regions, and pelvic cul-de-sac. Transvaginal technique was performed to assess early pregnancy. COMPARISON:  Obstetric ultrasound 04/14/2020 FINDINGS: Intrauterine gestational sac: Single Yolk sac:  Visualized. Embryo:  Visualized. Cardiac Activity: Visualized. Heart Rate: 133 bpm CRL:  9.1 mm   6 w   6 d                  [place] EDC: 12/11/2020 Subchorionic hemorrhage: The previous subchorionic hemorrhage noted adjacent to the gestational sac is not seen, there are no blood products in the lower uterine segment. Maternal uterus/adnexae: Avascular blood products in the lower endocervical canal in the lower uterine segment. There is a corpus luteal cyst in the right ovary, as seen on prior exam. Left ovary appears normal. IMPRESSION: 1. Single live intrauterine pregnancy estimated gestational age [redacted] weeks 6 days based on crown-rump length for ultrasound [facility] 12/11/2020. There is been interval fetal growth since prior ultrasound. 2. The previous subchorionic hemorrhage is no longer seen about the gestational sac, there heterogeneous blood products in the endocervical canal in the lower uterine segment. Electronically Signed    By: Keith Rake M.D.   On: 04/23/2020 20:52    Procedures Procedures (including critical care time)  Medications Ordered in ED Medications - No data to display  ED Course  I have reviewed the triage vital signs and the nursing notes.  Pertinent labs & imaging results that were available during my care of the patient were reviewed by me and considered in my medical decision making (see chart for details).    MDM Rules/Calculators/A&P                      Stat labs. Continuous pulse ox and monitor. Ecg. Poison control notified by RN/staff.   Reviewed nursing notes and prior charts for additional history.   1 week ago with u/s showing 5 w 5 day iup. Blood type A-, received rhogam then.   Initial labs reviewed/interpreted by me - bhcg increased from prior - given recent/ongoing bleeding, albeit improved from prior, will get repeat u/s today.  Acetaminophen and salicylate levels normal. Rhogram.   Given ingestion/benadryl - will observe in ED for next several hours. Vitals stable. Monitor.   Recheck pt, no new c/o. Is awake and alert, no resp depression. No recurrent nv. abd soft nt.   [place] team consulted.   U/s reviewed/interpreted by me - interval increase in size, iup, ~ 7 weeks.  2110, pt alert, content, no pain. No bleeding. abd soft nt. No resp depression. Signed out to [facility] EDP - follow up with [facility] assessment, recheck pt, and dispo appropriately.          Final Clinical Impression(s) / ED Diagnoses Final diagnoses:  Vaginal bleeding    Rx / DC Orders ED Discharge Orders    None         Lajean Saver, MD 04/23/20 2133

## 2020-04-23 NOTE — ED Notes (Signed)
Pt alert vomiting  drowsy

## 2020-04-23 NOTE — ED Notes (Signed)
To us

## 2020-04-23 NOTE — ED Notes (Signed)
Pt returned from ultra souond

## 2020-04-23 NOTE — ED Notes (Signed)
Sleepy still

## 2020-04-23 NOTE — ED Notes (Signed)
Poison controlled called to inquire about recommended updated blood work, BMP and Tylenol level,this RN and posion controlled informed by off going RN that EDP did not reorder lab work; Patient is A&Ox4 on arrival to unit at this time; Kennon Holter has arrived to [employer]

## 2020-04-23 NOTE — ED Triage Notes (Signed)
The pt arrived by gems from home  She is 7-9 weeks preg she has had difficulty with this pregnancy from the beginning  lmp maybe [id number]  She was seen at [facility] long ed 4-23 for the same

## 2020-04-23 NOTE — ED Notes (Addendum)
The pt last ate at 1200n and approx 45 minutes she was hurting all over and she took approx 25  Combination advil and benadryl  She has had voomiting since  She took the pills  She reports she was not attempting to kill herself  She just wanted to sleep  She has had a recent boyfriend that is deceased in the past 2 weeks  Sister at   The bedside  Her mother has called but the pt does not want to discuss

## 2020-04-23 NOTE — ED Notes (Signed)
Poison control [phone] notified recommendation for bmp tylenol level now  Observation time almost complete dr Ashok Cordia notified

## 2020-04-24 LAB — BASIC METABOLIC PANEL
Anion gap: 9 (ref 5–15)
BUN: 5 mg/dL — ABNORMAL LOW (ref 6–20)
CO2: 21 mmol/L — ABNORMAL LOW (ref 22–32)
Calcium: 8.6 mg/dL — ABNORMAL LOW (ref 8.9–10.3)
Chloride: 104 mmol/L (ref 98–111)
Creatinine, Ser: 0.95 mg/dL (ref 0.44–1.00)
GFR calc Af Amer: >60 mL/min (ref >60)
GFR calc non Af Amer: >60 mL/min (ref >60)
Glucose, Bld: 111 mg/dL — ABNORMAL HIGH (ref 70–99)
Potassium: 3.2 mmol/L — ABNORMAL LOW (ref 3.5–5.1)
Sodium: 134 mmol/L — ABNORMAL LOW (ref 135–145)

## 2020-04-24 LAB — CBC
HCT: 32.7 % — ABNORMAL LOW (ref 36.0–46.0)
Hemoglobin: 10.8 g/dL — ABNORMAL LOW (ref 12.0–15.0)
MCH: 28.1 pg (ref 26.0–34.0)
MCHC: 33 g/dL (ref 30.0–36.0)
MCV: 85.2 fL (ref 80.0–100.0)
Platelets: 138 10*3/uL — ABNORMAL LOW (ref 150–400)
RBC: 3.84 MIL/uL — ABNORMAL LOW (ref 3.87–5.11)
RDW: 13.1 % (ref 11.5–15.5)
WBC: 10.7 10*3/uL — ABNORMAL HIGH (ref 4.0–10.5)
nRBC: 0 % (ref 0.0–0.2)

## 2020-04-24 LAB — RESPIRATORY PANEL BY RT PCR (FLU A&B, COVID)
Influenza A by PCR: NEGATIVE
Influenza B by PCR: NEGATIVE
SARS Coronavirus 2 by RT PCR: NEGATIVE

## 2020-04-24 MED ORDER — PRENATAL MULTIVITAMIN CH
1.0000 | ORAL_TABLET | Freq: Every day | ORAL | Status: DC
  Administered 2020-04-24 – 2020-04-25 (×2): 1 via ORAL
  Filled 2020-04-24 (×2): qty 1

## 2020-04-24 MED ORDER — ONDANSETRON 4 MG PO TBDP
4.0000 mg | ORAL_TABLET | Freq: Once | ORAL | Status: AC
  Administered 2020-04-24: 02:00:00 4 mg via ORAL
  Filled 2020-04-24: qty 1

## 2020-04-24 NOTE — Progress Notes (Signed)
Patient declined bed offer to [facility] due to having friends that work at that hospital. TTS will cancel the bed offer and continue to find placement.   Domenic Schwab, MSW, LCSW-A Clinical Disposition Social Worker [facility]/TTS [phone]

## 2020-04-24 NOTE — Progress Notes (Addendum)
Pt accepted to [facility]    Dr. Jonelle Sports is the accepting/attending provider.  Call report to [phone]  Santiago Glad @ [facility] ED notified.     Pt is voluntary and will be transported by [facility]  Pt is scheduled to arrive at [facility] on 04/25/20 after [place], [place], [place] Disposition CSW [facility]/TTS [phone]

## 2020-04-24 NOTE — Progress Notes (Signed)
Pt meets inpatient criteria per Lindon Romp, NP. Referral information has been sent to the following hospitals for review:  [facility] Details [place] Details [facility] Details [facility] Details  [facility] Details [place] Details  [place] Details [facility] Details [facility]  Disposition will continue to assist with inpatient placement needs.   Audree Camel, LCSW, [place] Disposition [facility]/TTS [phone]

## 2020-04-24 NOTE — ED Provider Notes (Signed)
34 year old female received at signout from [place]. Patient was initially seen and evaluated by Dr. Ashok Cordia. Per his HPI:  "Patient presents s/p ingestion ~15-25 ibuprofen/pm tablets around 3 pm today. Symptoms acute onset, mod-severe, episodic. Denies other ingestion. Patient indicated was under a lot of stress lately, and was also stressed by recent possible miscarriage. Patient with recent ED visit 1 week ago with vaginal bleeding - had ~ 6 week pregnancy on u/s then. Since then did have mild cramping, and bleeding - although notes no abd or pelvic pain. Mild bleeding persists, but improved from earlier visit. No faintness or dizziness. Post ingestion, onset of several episodes nv, but ems notes no pills/pill fragment in emesis. Notes cbg normal. bp normal.   The history is provided by the patient and the EMS personnel. The history is limited by the condition of the patient."   On my evaluation, patient states that she took approximately 15 to 16 tablets of Advil PM at approximately 15:30 today.  Denies other ingestion.  She is having nausea, but has no other complaints at this time.  She was able to tolerate crackers and juice.  Physical Exam  BP 122/87   Pulse 86   Temp 99.3 F (37.4 C) (Oral)   Resp 19   Ht 5\' 3"  (1.6 m)   Wt 71.2 kg   LMP 02/18/2020 (Approximate)   SpO2 100%   BMI 27.81 kg/m   Physical Exam Vitals and nursing note reviewed.  Constitutional:      Appearance: She is well-developed.     Comments: Sleeping comfortably, with equal even respirations on arrival to the room.  Awakens easily to voice.  HENT:     Head: Normocephalic and atraumatic.  Cardiovascular:     Rate and Rhythm: Normal rate.  Pulmonary:     Effort: Pulmonary effort is normal.  Musculoskeletal:        General: Normal range of motion.     Cervical back: Normal range of motion.  Skin:    General: Skin is warm and dry.  Neurological:     Mental Status: She is alert.     Comments: Speaks in  complete, fluent sentences without slurred speech.    ED Course/Procedures     Procedures  MDM   34 year old female received at signout from [place].  Patient was initially seen by Dr. Delice Lesch all.  Please see the his note for further work-up and medical decision making.  In brief, patient had an ingestion of ibuprofen with Benadryl (Advil PM) tablets.  There is some question to the exact time and the number of tablets as it was initially reported that she may have taken 15 to 25 tablets anywhere between 2-3 PM as patient was very drowsy and altered on arrival to the ER.  Patient is also approximately 6 weeks and 6 days pregnant as confirmed by pelvic ultrasound in the ER today.  She had multiple episodes of vomiting earlier, the vomiting has subsided.  She continues to endorse nausea.  Spoke with Danielle at poison control.  Primary concerns are for renal injury, gastritis, or developing metabolic acidosis.  Since she has had multiple episodes of vomiting, she recommends repeating BMP.  Regarding Benadryl, typically most onset is within 6 hours of ingestion and to monitor for signs of urinary retention, altered mental status, tachycardia.  It is class B with pregnancy.  Repeat metabolic panel is pending.  Patient did also have a thrombocytopenia and will recheck CBC.  She is  producing urine.  There is no tachycardia, altered mental status.  If repeat labs are reassuring, she will be medically cleared at that time.  Repeat labs are minimally unchanged.  TTS has been consulted and recommends inpatient treatment.  They will seek placement.  Pt medically cleared at this time.  We will start the patient on a daily prenatal vitamin.  No other home medications to order. Please see psych team notes for further documentation of care/dispo. Pt stable at time of med clearance.       Joline Maxcy A, PA-C 04/24/20 [place], Delice Bison, DO 04/24/20 [phone]

## 2020-04-24 NOTE — ED Notes (Signed)
Given breakfast- c/o abd cramping and nausea.

## 2020-04-24 NOTE — ED Notes (Signed)
Dinner ordered 

## 2020-04-24 NOTE — ED Notes (Signed)
ED Provider at bedside. 

## 2020-04-24 NOTE — ED Notes (Signed)
Pt declining bed at [facility], social work notified of patient's decision and will try to find pt placement elsewhere.

## 2020-04-25 ENCOUNTER — Inpatient Hospital Stay (HOSPITAL_COMMUNITY): Admission: AD | Admit: 2020-04-25 | Payer: Medicaid Other | Source: Intra-hospital | Admitting: Psychiatry

## 2020-04-25 ENCOUNTER — Other Ambulatory Visit: Payer: Self-pay

## 2020-04-25 NOTE — Progress Notes (Signed)
Pt accepted to [facility], bed 303-2    Mordecai Maes, NP is the accepting provider.    Dr. Mallie Darting is the attending provider.    Call report to [phone]    Pt is voluntary and will be transported by [employer]  Pt is scheduled to arrive at [facility] at [place], [place], [place] Disposition [place] [facility]/TTS [phone]

## 2020-04-25 NOTE — ED Notes (Addendum)
Patricia Valencia, [place] - called back and advised they are able to accept pt. [facility] called for transportation. Pt aware and voiced agreement w/tx plan. Offered for pt to call family member/friend - declined d/t RN unable to provide pt w/her cell phone to call from room. ALL belongings - 1 labeled belongings bag and 1 valuables envelope - [facility] - Pt aware.

## 2020-04-25 NOTE — Discharge Planning (Signed)
EDCM to follow for disposition needs.  

## 2020-04-25 NOTE — ED Notes (Addendum)
States she was not intentionally trying to harm herself when she  "took too many pills. I just wanted to rest and I'm pregnant so I'm hormonal". States she was feeling lonely and did not know how to ask for help and also grieving loss of her baby's daddy. States she recently moved to [place] and does not have family here. States she now has the support of her family. 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 States she does not like being "kept in this small room and I can't shower". Advised pt she may shower whenever she is ready and apologized if it had not been offered to her. Also stating she does not like not being able to close the door to her room and having someone watching her all of the time - Voiced understanding of safety measures that must be maintained. Pt noted w/cell phone - states "it was approved by the other shift nurses for me to have it. Maybe even approved by the dr". Advised pt it will need to be removed from room and locked up w/Security. Pt noted to become upset - stating she needed to call her child's school first. States she is "trying not to let my emotions go so you won't think I'm crazy. I'm holding back the tears". States she did not want to go to [facility] yesterday d/t she has family and friends who work there. Advised pt [facility] SW will be talking w/her today as they do everyday while seeking Inpt Placement. Pt voiced understanding then states "I just want to go home". Also states she is upset d/t "No one has come and checked on me" - referring to [facility] SW - advised they communicate w/her via Telepsych machine only. Pt noted to be talking in a calm voice.

## 2020-04-25 NOTE — ED Notes (Signed)
Patricia Chen RN - [facility] - called back and advised pt will be accepted to [facility] d/t experiencing vaginal bleeding while pregnant. Lamarr Lulas SW to advise.

## 2020-04-25 NOTE — ED Notes (Signed)
Pt finished making phone calls to her children's school - cell phone w/charger and block - placed in Valuables Envelope.

## 2020-04-25 NOTE — ED Notes (Signed)
Breakfast Ordered 

## 2020-04-25 NOTE — ED Provider Notes (Addendum)
34 year old female presenting after ingestion.  She has been recommended for inpatient and is awaiting transfer.  She does not report any medical complaints today but does state that she does not want to be here.  She states she is willing to be accepted at [facility]   Physical Exam  BP 127/77 (BP Location: Left Arm)   Pulse 70   Temp 98.6 F (37 C)   Resp 16   Ht 5\' 3"  (1.6 m)   Wt 71.2 kg   LMP 02/18/2020 (Approximate)   SpO2 100%   BMI 27.81 kg/m   Physical Exam Vitals and nursing note reviewed.  Constitutional:      General: She is not in acute distress.    Appearance: She is well-developed.  HENT:     Head: Normocephalic and atraumatic.  Eyes:     Conjunctiva/sclera: Conjunctivae normal.  Cardiovascular:     Rate and Rhythm: Normal rate.  Pulmonary:     Effort: Pulmonary effort is normal.  Musculoskeletal:        General: Normal range of motion.     Cervical back: Neck supple.  Neurological:     Mental Status: She is alert.       ED Course/Procedures     Procedures  Results for orders placed or performed during the hospital encounter of 04/23/20  Respiratory Panel by RT PCR (Flu A&B, Covid) - Nasopharyngeal Swab   Specimen: Nasopharyngeal Swab  Result Value Ref Range   SARS Coronavirus 2 by RT PCR NEGATIVE NEGATIVE   Influenza A by PCR NEGATIVE NEGATIVE   Influenza B by PCR NEGATIVE NEGATIVE  Comprehensive metabolic panel  Result Value Ref Range   Sodium 136 135 - 145 mmol/L   Potassium 3.7 3.5 - 5.1 mmol/L   Chloride 104 98 - 111 mmol/L   CO2 22 22 - 32 mmol/L   Glucose, Bld 104 (H) 70 - 99 mg/dL   BUN <5 (L) 6 - 20 mg/dL   Creatinine, Ser 0.87 0.44 - 1.00 mg/dL   Calcium 8.9 8.9 - 10.3 mg/dL   Total Protein 6.5 6.5 - 8.1 g/dL   Albumin 3.5 3.5 - 5.0 g/dL   AST 20 15 - 41 U/L   ALT 15 0 - 44 U/L   Alkaline Phosphatase 39 38 - 126 U/L   Total Bilirubin 0.5 0.3 - 1.2 mg/dL   GFR calc non Af Amer >60 >60 mL/min   GFR calc Af Amer >60 >60  mL/min   Anion gap 10 5 - 15  CBC  Result Value Ref Range   WBC 10.8 (H) 4.0 - 10.5 K/uL   RBC 4.02 3.87 - 5.11 MIL/uL   Hemoglobin 11.3 (L) 12.0 - 15.0 g/dL   HCT 34.0 (L) 36.0 - 46.0 %   MCV 84.6 80.0 - 100.0 fL   MCH 28.1 26.0 - 34.0 pg   MCHC 33.2 30.0 - 36.0 g/dL   RDW 12.9 11.5 - 15.5 %   Platelets 132 (L) 150 - 400 K/uL   nRBC 0.0 0.0 - 0.2 %  Ethanol  Result Value Ref Range   Alcohol, Ethyl (B) <10 <10 mg/dL  Acetaminophen level  Result Value Ref Range   Acetaminophen (Tylenol), Serum <10 (L) 10 - 30 ug/mL  Salicylate level  Result Value Ref Range   Salicylate Lvl Q000111Q (L) 7.0 - 30.0 mg/dL  Rapid urine drug screen (hospital performed)  Result Value Ref Range   Opiates NONE DETECTED NONE DETECTED   Cocaine NONE  DETECTED NONE DETECTED   Benzodiazepines NONE DETECTED NONE DETECTED   Amphetamines NONE DETECTED NONE DETECTED   Tetrahydrocannabinol NONE DETECTED NONE DETECTED   Barbiturates NONE DETECTED NONE DETECTED  hCG, quantitative, pregnancy  Result Value Ref Range   hCG, Beta Chain, Quant, S 71,618 (H) <5 mIU/mL  CBC  Result Value Ref Range   WBC 10.7 (H) 4.0 - 10.5 K/uL   RBC 3.84 (L) 3.87 - 5.11 MIL/uL   Hemoglobin 10.8 (L) 12.0 - 15.0 g/dL   HCT 32.7 (L) 36.0 - 46.0 %   MCV 85.2 80.0 - 100.0 fL   MCH 28.1 26.0 - 34.0 pg   MCHC 33.0 30.0 - 36.0 g/dL   RDW 13.1 11.5 - 15.5 %   Platelets 138 (L) 150 - 400 K/uL   nRBC 0.0 0.0 - 0.2 %  Basic metabolic panel  Result Value Ref Range   Sodium 134 (L) 135 - 145 mmol/L   Potassium 3.2 (L) 3.5 - 5.1 mmol/L   Chloride 104 98 - 111 mmol/L   CO2 21 (L) 22 - 32 mmol/L   Glucose, Bld 111 (H) 70 - 99 mg/dL   BUN 5 (L) 6 - 20 mg/dL   Creatinine, Ser 0.95 0.44 - 1.00 mg/dL   Calcium 8.6 (L) 8.9 - 10.3 mg/dL   GFR calc non Af Amer >60 >60 mL/min   GFR calc Af Amer >60 >60 mL/min   Anion gap 9 5 - 15   US OB Comp < 14 Wks  Result Date: 04/14/2020 CLINICAL DATA:  Pregnant patient with vaginal bleeding. EXAM:  OBSTETRIC <14 WK [place] AND TRANSVAGINAL OB US TECHNIQUE: Both transabdominal and transvaginal ultrasound examinations were performed for complete evaluation of the gestation as well as the maternal uterus, adnexal regions, and pelvic cul-de-sac. Transvaginal technique was performed to assess early pregnancy. COMPARISON:  None. FINDINGS: Intrauterine gestational sac: Single visualized. Yolk sac:  Visualized. Embryo:  Visualized. Cardiac Activity: Detected. Heart Rate: 89 bpm CRL:  2.2 mm   5 w   5 d                  [place] EDC: 12/10/2020. Subchorionic hemorrhage: Moderately large to large. Maternal uterus/adnexae: Corpus luteum cyst on the right noted. Left ovary not visualized. Small volume of free pelvic fluid is noted. IMPRESSION: Single living intrauterine pregnancy. Moderately large to large subchorionic hemorrhage. Electronically Signed   By: Inge Rise M.D.   On: 04/14/2020 09:50   US OB Transvaginal  Result Date: 04/14/2020 CLINICAL DATA:  Pregnant patient with vaginal bleeding. EXAM: OBSTETRIC <14 WK [place] AND TRANSVAGINAL OB US TECHNIQUE: Both transabdominal and transvaginal ultrasound examinations were performed for complete evaluation of the gestation as well as the maternal uterus, adnexal regions, and pelvic cul-de-sac. Transvaginal technique was performed to assess early pregnancy. COMPARISON:  None. FINDINGS: Intrauterine gestational sac: Single visualized. Yolk sac:  Visualized. Embryo:  Visualized. Cardiac Activity: Detected. Heart Rate: 89 bpm CRL:  2.2 mm   5 w   5 d                  [place] EDC: 12/10/2020. Subchorionic hemorrhage: Moderately large to large. Maternal uterus/adnexae: Corpus luteum cyst on the right noted. Left ovary not visualized. Small volume of free pelvic fluid is noted. IMPRESSION: Single living intrauterine pregnancy. Moderately large to large subchorionic hemorrhage. Electronically Signed   By: Inge Rise M.D.   On: 04/14/2020 09:50   US OB LESS THAN 14 WEEKS WITH OB  TRANSVAGINAL  Result Date: 04/23/2020 CLINICAL DATA:  Pregnant patient in first-trimester pregnancy with vaginal bleeding. EXAM: OBSTETRIC <14 WK [place] AND TRANSVAGINAL OB US TECHNIQUE: Both transabdominal and transvaginal ultrasound examinations were performed for complete evaluation of the gestation as well as the maternal uterus, adnexal regions, and pelvic cul-de-sac. Transvaginal technique was performed to assess early pregnancy. COMPARISON:  Obstetric ultrasound 04/14/2020 FINDINGS: Intrauterine gestational sac: Single Yolk sac:  Visualized. Embryo:  Visualized. Cardiac Activity: Visualized. Heart Rate: 133 bpm CRL:  9.1 mm   6 w   6 d                  [place] EDC: 12/11/2020 Subchorionic hemorrhage: The previous subchorionic hemorrhage noted adjacent to the gestational sac is not seen, there are no blood products in the lower uterine segment. Maternal uterus/adnexae: Avascular blood products in the lower endocervical canal in the lower uterine segment. There is a corpus luteal cyst in the right ovary, as seen on prior exam. Left ovary appears normal. IMPRESSION: 1. Single live intrauterine pregnancy estimated gestational age [redacted] weeks 6 days based on crown-rump length for ultrasound [facility] 12/11/2020. There is been interval fetal growth since prior ultrasound. 2. The previous subchorionic hemorrhage is no longer seen about the gestational sac, there heterogeneous blood products in the endocervical canal in the lower uterine segment. Electronically Signed   By: Keith Rake M.D.   On: 04/23/2020 20:52     MDM    34 year old female presenting after ingestion.  She has been recommended for inpatient and is awaiting transfer.  She does not report any medical complaints today but does state that she does not want to be here.  She states she is willing to be accepted at [facility].  11:46 PM nursing advised that patient was accepted to [facility]  Nursing updated me that [facility] is now saying they do not want to accept  the patient due to her vaginal bleeding.  On review of records, pt had had vaginal bleeding on her initial eval and was also seen in the ED for this last week. Dx with threatened miscarriage. She is rh neg and was also given rhogam. Pt had not informed me that her bleeding was still ongoing when I evaluated her initially this morning. I was made aware of it by nursing staff when pt was in the process of being transferred. Discussed with attending physician, Dr. Sherry Ruffing who recommends consultation with OB-GYN. Consultation was placed in the chart however was not yet called before the patient was transferred out of the department.        Rodney Booze, PA-C 04/25/20 1323    Rodney Booze, PA-C 04/25/20 1731    Maudie Flakes, MD 04/27/20 1123

## 2020-04-25 NOTE — ED Notes (Signed)
Pt signed consent form for [facility] - Copy faxed to [facility] - Copy sent to Medical Records - Original placed in envelope for [facility].

## 2020-04-25 NOTE — ED Notes (Addendum)
Pt in shower. Pad and mesh underwear given.

## 2020-04-25 NOTE — ED Notes (Signed)
Per Guys, [facility] AC - Pt has been accepted to [facility] 303-2 - arrive 1400. Pt aware and voiced agreement.

## 2020-04-25 NOTE — Progress Notes (Signed)
Pt accepted to [facility]; Room to be determined.   Dr. Jonelle Sports is the accepting and attending physician.   Call report to [phone].   Becky @ [facility] ED notified.     Pt is Voluntary.    Pt may be transported by [place].   Pt scheduled to arrive as soon as possible. The bed is available now.   Domenic Schwab, MSW, LCSW-A Clinical Disposition Social Worker [facility]/TTS [phone]

## 2020-04-25 NOTE — ED Notes (Signed)
[facility] transporting pt to [facility]. Pt aware Patricia Valencia [phone] - called.

## 2020-04-25 NOTE — ED Notes (Addendum)
Per BH SW - [facility] does not have bed available today for pt - Pt has been accepted to [facility] - Dr Selinda Flavin [phone]. RN attempted to call report - RN to check w/Dr d/t pt continuing to experience vaginal bleeding and call this RN back.

## 2020-05-15 ENCOUNTER — Encounter (HOSPITAL_COMMUNITY): Payer: Self-pay | Admitting: *Deleted

## 2020-05-15 ENCOUNTER — Emergency Department (HOSPITAL_COMMUNITY)
Admission: EM | Admit: 2020-05-15 | Discharge: 2020-05-15 | Disposition: A | Discharge status: Home / Self Care | Payer: Medicaid Other | Attending: Emergency Medicine | Admitting: Emergency Medicine

## 2020-05-15 ENCOUNTER — Emergency Department (HOSPITAL_COMMUNITY): Payer: Medicaid Other

## 2020-05-15 ENCOUNTER — Other Ambulatory Visit: Payer: Self-pay

## 2020-05-15 DIAGNOSIS — O219 Vomiting of pregnancy, unspecified: Secondary | ICD-10-CM | POA: Diagnosis not present

## 2020-05-15 DIAGNOSIS — J302 Other seasonal allergic rhinitis: Secondary | ICD-10-CM | POA: Insufficient documentation

## 2020-05-15 DIAGNOSIS — Z79899 Other long term (current) drug therapy: Secondary | ICD-10-CM | POA: Insufficient documentation

## 2020-05-15 DIAGNOSIS — R079 Chest pain, unspecified: Secondary | ICD-10-CM | POA: Insufficient documentation

## 2020-05-15 DIAGNOSIS — O26891 Other specified pregnancy related conditions, first trimester: Secondary | ICD-10-CM | POA: Insufficient documentation

## 2020-05-15 DIAGNOSIS — Z3A11 11 weeks gestation of pregnancy: Secondary | ICD-10-CM | POA: Diagnosis not present

## 2020-05-15 DIAGNOSIS — R519 Headache, unspecified: Secondary | ICD-10-CM | POA: Insufficient documentation

## 2020-05-15 LAB — BASIC METABOLIC PANEL
Anion gap: 5 (ref 5–15)
BUN: 10 mg/dL (ref 6–20)
CO2: 23 mmol/L (ref 22–32)
Calcium: 8.5 mg/dL — ABNORMAL LOW (ref 8.9–10.3)
Chloride: 106 mmol/L (ref 98–111)
Creatinine, Ser: 0.82 mg/dL (ref 0.44–1.00)
GFR calc Af Amer: >60 mL/min (ref >60)
GFR calc non Af Amer: >60 mL/min (ref >60)
Glucose, Bld: 100 mg/dL — ABNORMAL HIGH (ref 70–99)
Potassium: 3.8 mmol/L (ref 3.5–5.1)
Sodium: 134 mmol/L — ABNORMAL LOW (ref 135–145)

## 2020-05-15 LAB — CBC
HCT: 35.3 % — ABNORMAL LOW (ref 36.0–46.0)
Hemoglobin: 11.4 g/dL — ABNORMAL LOW (ref 12.0–15.0)
MCH: 27.3 pg (ref 26.0–34.0)
MCHC: 32.3 g/dL (ref 30.0–36.0)
MCV: 84.7 fL (ref 80.0–100.0)
Platelets: 137 10*3/uL — ABNORMAL LOW (ref 150–400)
RBC: 4.17 MIL/uL (ref 3.87–5.11)
RDW: 12.5 % (ref 11.5–15.5)
WBC: 10.3 10*3/uL (ref 4.0–10.5)
nRBC: 0 % (ref 0.0–0.2)

## 2020-05-15 LAB — URINALYSIS, ROUTINE W REFLEX MICROSCOPIC
Bilirubin Urine: NEGATIVE
Glucose, UA: NEGATIVE mg/dL
Hgb urine dipstick: NEGATIVE
Ketones, ur: NEGATIVE mg/dL
Leukocytes,Ua: NEGATIVE
Nitrite: NEGATIVE
Protein, ur: NEGATIVE mg/dL
Specific Gravity, Urine: 1.016 (ref 1.005–1.030)
pH: 7 (ref 5.0–8.0)

## 2020-05-15 LAB — I-STAT BETA HCG BLOOD, ED (MC, WL, AP ONLY)
I-stat hCG, quantitative: >2000 m[IU]/mL — ABNORMAL HIGH (ref <5)
I-stat hCG, quantitative: >2000 m[IU]/mL — ABNORMAL HIGH (ref <5)
I-stat hCG, quantitative: >2000 m[IU]/mL — ABNORMAL HIGH (ref <5)

## 2020-05-15 LAB — TROPONIN I (HIGH SENSITIVITY): Troponin I (High Sensitivity): <2 ng/L (ref <18)

## 2020-05-15 MED ORDER — METOCLOPRAMIDE HCL 5 MG/ML IJ SOLN
5.0000 mg | Freq: Once | INTRAMUSCULAR | Status: AC
  Administered 2020-05-15: 5 mg via INTRAVENOUS
  Filled 2020-05-15: qty 2

## 2020-05-15 MED ORDER — ACETAMINOPHEN 325 MG PO TABS
650.0000 mg | ORAL_TABLET | Freq: Once | ORAL | Status: AC
  Administered 2020-05-15: 650 mg via ORAL
  Filled 2020-05-15: qty 2

## 2020-05-15 MED ORDER — SODIUM CHLORIDE 0.9% FLUSH
3.0000 mL | Freq: Once | INTRAVENOUS | Status: AC
  Administered 2020-05-15: 3 mL via INTRAVENOUS

## 2020-05-15 NOTE — ED Provider Notes (Signed)
Will medicateMedical screening examination/treatment/procedure(s) were conducted as a shared visit with non-physician practitioner(s) and myself.  I personally evaluated the patient during the encounter.  EKG Interpretation  Date/Time:  Monday May 15 2020 11:40:28 EDT Ventricular Rate:  92 PR Interval:    QRS Duration: 80 QT Interval:  348 QTC Calculation: 431 R Axis:   54 Text Interpretation: Sinus rhythm Low voltage, precordial leads 12 Lead; Mason-Likar Confirmed by Lacretia Leigh ([id number]) on 05/15/2020 12:66:37 PM   34 year old female presents with frontal headache is worse with leaning forward.  Patient denies any emesis.  No fever or chills.  On exam she is neurologically stable.  For pain here and likely discharge   Lacretia Leigh, MD 05/15/20 1240

## 2020-05-15 NOTE — Discharge Instructions (Addendum)
Per our discussion, I would recommend that you begin taking Zyrtec again for your seasonal allergies. You can buy the generic brand of this medication called cetirizine. You could also try Claritin which is also called loratadine.  I would also recommend taking Flonase. Again, there is a generic version of this. You can purchase this. I would recommend 1 or 2 puffs in each nostril once or twice a day.  For pain management, you can take acetaminophen or Tylenol. Please no longer take ibuprofen while pregnant.  I believe your chest pain might be secondary to some mild acid reflux. This is common in pregnancy. Try to eat dinner a bit earlier. Try to eat smaller meals. I have attached some information regarding how to prevent this in the future. You can take Tums safely. Please follow the instructions on the bottle.  If your symptoms worsen please return to the emergency department for reevaluation. Please make sure you follow-up with your OB/GYN at your scheduled appointment on June 4. It was a pleasure to meet you.

## 2020-05-15 NOTE — ED Provider Notes (Signed)
[place] DEPT Provider Note   CSN: [id number] Arrival date & time: 05/15/20  1130     History Chief Complaint  Patient presents with  . Headache    Patricia Valencia is a 34 y.o. female.  HPI HPI Comments: Patricia Quezada is a 34 y.o. female G5P3A1 that is about [redacted] weeks pregnant who presents to the Emergency Department complaining of a HA. She states her HA has been the past two days and is waxing and waning. Took ibuprofen yesterday with short term relief. Positive photophobia. Pt has a history of seasonal allergies. Has been out of zyrtec for two weeks. Pain is mostly around sinuses and temples. Pt has been experiencing daily n/v during pregnancy but denies this has acutely worsened. No abdominal pain. Pt has a regular OBGYN and has been going to her scheduled appointments. Her next one is on 05/26/2020.   Pt additionally complains of central sharp CP that lasted about five minutes last night. She states she has a history of GERD in prior pregnancies. Her pain worsens with palpation. She sat up in bed last night and went back to sleep and her pain resolved. She denies SOB, fevers, chills, dysuria, syncope.      History reviewed. No pertinent past medical history.  There are no problems to display for this patient.   History reviewed. No pertinent surgical history.   OB History    Gravida  1   Para      Term      Preterm      AB      Living        SAB      TAB      Ectopic      Multiple      Live Births              No family history on file.  Social History   Tobacco Use  . Smoking status: Never Smoker  . Smokeless tobacco: Never Used  Substance Use Topics  . Alcohol use: Never  . Drug use: Never    Home Medications Prior to Admission medications   Medication Sig Start Date End Date Taking? Authorizing Provider  diphenhydramine-acetaminophen (TYLENOL PM) 25-500 MG TABS tablet Take 2 tablets by mouth at bedtime  as needed (pain/sleep).    [provider]  Ibuprofen-diphenhydrAMINE Cit (ADVIL PM PO) Take 25 tablets by mouth once.    [provider]  Multiple Vitamin (MULTIVITAMIN WITH MINERALS) TABS tablet Take 1 tablet by mouth daily.    [provider]    Allergies    Sulfa antibiotics  Review of Systems   Review of Systems  All other systems reviewed and are negative. Ten systems reviewed and are negative for acute change, except as noted in the HPI.   Physical Exam Updated Vital Signs BP 133/75 (BP Location: Right Arm)   Pulse 85   Temp 99.9 F (37.7 C) (Oral)   Resp 15   Ht 5\' 3"  (1.6 m)   Wt 71.2 kg   LMP 02/18/2020 (Approximate)   SpO2 100%   BMI 27.82 kg/m   Physical Exam Vitals and nursing note reviewed.  Constitutional:      General: She is not in acute distress.    Appearance: Normal appearance. She is not ill-appearing, toxic-appearing or diaphoretic.  HENT:     Head: Normocephalic and atraumatic.     Comments: Boggy nasal turbinates noted bilaterally. Positive TTP noted over the frontal sinuses.  No erythema or edema noted. Bilateral EACs are clear.     Right Ear: Hearing, tympanic membrane, ear canal and external ear normal.     Left Ear: Hearing, tympanic membrane, ear canal and external ear normal.     Nose:     Right Turbinates: Enlarged and swollen.     Left Turbinates: Enlarged and swollen.     Right Sinus: Frontal sinus tenderness present.     Left Sinus: Frontal sinus tenderness present.     Mouth/Throat:     Mouth: Mucous membranes are moist.     Pharynx: Oropharynx is clear. No oropharyngeal exudate or posterior oropharyngeal erythema.  Eyes:     General: No scleral icterus.    Extraocular Movements: Extraocular movements intact.     Right eye: Normal extraocular motion and no nystagmus.     Left eye: Normal extraocular motion and no nystagmus.     Pupils: Pupils are equal, round, and reactive to light. Pupils are equal.      Right eye: Pupil is round and reactive.     Left eye: Pupil is round and reactive.  Cardiovascular:     Rate and Rhythm: Normal rate and regular rhythm.     Pulses: Normal pulses.     Heart sounds: Normal heart sounds. No murmur. No friction rub. No gallop.   Pulmonary:     Effort: Pulmonary effort is normal. No respiratory distress.     Breath sounds: Normal breath sounds. No stridor. No wheezing, rhonchi or rales.  Chest:     Chest wall: Tenderness (moderate TTP noted overlying the sternum and just left lateral of the sternum) present.  Abdominal:     General: Abdomen is flat. There is no distension.     Palpations: Abdomen is soft.     Tenderness: There is no abdominal tenderness. There is no guarding.     Comments: Appropriately gravid abdomen. Non tender and soft throughout.   Musculoskeletal:        General: Normal range of motion.     Cervical back: Normal range of motion and neck supple. No rigidity or tenderness.  Lymphadenopathy:     Cervical: No cervical adenopathy.  Skin:    General: Skin is warm and dry.  Neurological:     General: No focal deficit present.     Mental Status: She is alert and oriented to person, place, and time.  Psychiatric:        Mood and Affect: Mood normal.        Behavior: Behavior normal.     ED Results / Procedures / Treatments   Labs (all labs ordered are listed, but only abnormal results are displayed) Labs Reviewed  BASIC METABOLIC PANEL - Abnormal; Notable for the following components:      Result Value   Sodium 134 (*)    Glucose, Bld 100 (*)    Calcium 8.5 (*)    All other components within normal limits  CBC - Abnormal; Notable for the following components:   Hemoglobin 11.4 (*)    HCT 35.3 (*)    Platelets 137 (*)    All other components within normal limits  URINALYSIS, ROUTINE W REFLEX MICROSCOPIC - Abnormal; Notable for the following components:   Color, Urine STRAW (*)    All other components within normal limits    I-STAT BETA HCG BLOOD, ED ([facility], [facility], [facility] ONLY) - Abnormal; Notable for the following components:   I-stat hCG, quantitative >2,000.0 (*)    All  other components within normal limits  I-STAT BETA HCG BLOOD, ED ([facility], [facility], [facility] ONLY) - Abnormal; Notable for the following components:   I-stat hCG, quantitative >2,000.0 (*)    All other components within normal limits  I-STAT BETA HCG BLOOD, ED ([facility], [facility], [facility] ONLY) - Abnormal; Notable for the following components:   I-stat hCG, quantitative >2,000.0 (*)    All other components within normal limits  TROPONIN I (HIGH SENSITIVITY)  TROPONIN I (HIGH SENSITIVITY)   EKG EKG Interpretation  Date/Time:  Monday May 15 2020 11:40:28 EDT Ventricular Rate:  92 PR Interval:    QRS Duration: 80 QT Interval:  348 QTC Calculation: 431 R Axis:   54 Text Interpretation: Sinus rhythm Low voltage, precordial leads 12 Lead; Mason-Likar Confirmed by Lacretia Leigh ([id number]) on 05/15/2020 12:23:28 PM   Radiology DG Chest 2 View  Result Date: 05/15/2020 CLINICAL DATA:  34 y.o female c/o pressure headache that started Saturday. Pt had a nosebleed today. Pt states she was having tachycardia yesterday as well while taking her blood pressure. Left sided chest pain started at 0300 this morning. Pt is pregnant. EXAM: CHEST - 2 VIEW COMPARISON:  None. FINDINGS: The heart size and mediastinal contours are within normal limits. The lungs are clear. No pneumothorax or pleural effusion. The visualized skeletal structures are unremarkable. IMPRESSION: No acute cardiopulmonary process. Electronically Signed   By: Audie Pinto M.D.   On: 05/15/2020 12:02   Procedures Procedures (including critical care time)  Medications Ordered in ED Medications  sodium chloride flush (NS) 0.9 % injection 3 mL (3 mLs Intravenous Given 05/15/20 1230)  acetaminophen (TYLENOL) tablet 650 mg (650 mg Oral Given 05/15/20 1247)  metoCLOPramide (REGLAN) injection 5 mg (5 mg Intravenous Given 05/15/20  1247)   ED Course  I have reviewed the triage vital signs and the nursing notes.  Pertinent labs & imaging results that were available during my care of the patient were reviewed by me and considered in my medical decision making (see chart for details).    MDM Rules/Calculators/A&P                      Patient is a pleasant 34 year old female with history and physical exam as noted above.  Symptoms seem consistent with seasonal allergies as well as likely headache secondary to allergies and sinus pressure.  Initial labs were obtained in triage.  ECG is reassuring.  No elevation in BP.  No proteinuria.  Not concerned for preeclampsia at this time.  Slight decrease in hemoglobin at 11.4.  This seems consistent with priors.  BMP shows slight hypocalcemia at 8.5 and hyponatremia at 134.  Patient has been experiencing morning sickness recently.  This is happened in prior pregnancies as well.    Patient also had some chest pain today.  Initial troponin is negative.  Patient has palpable pain on exam.  We discussed how to prevent reflux symptoms.  She was given information on food choices.  We also discussed meal timing.  We additionally discussed her seasonal allergy symptoms.  I believe this is likely the cause of her headache.  Tylenol and Reglan seemed to help significantly with her headache.  I recommended that she start taking Tylenol for acute pain and discontinue NSAIDs.  I recommended that she restart her Zyrtec which she stopped taking about 2 weeks ago.  I additionally recommended Flonase.  We discussed dosing.  Her questions were answered and she was amicable at the time of discharge.  She has a follow-up appointment with her OB/GYN on June 4 and I urged her to keep this appointment as well as to discuss this visit.  She was amenable with the above plan.  Her vital signs were stable at the time of discharge.  Patient discharged to home/self care.  Condition at discharge: Stable  Note:  Portions of this report may have been transcribed using voice recognition software. Every effort was made to ensure accuracy; however, inadvertent computerized transcription errors may be present.    Final Clinical Impression(s) / ED Diagnoses Final diagnoses:  Seasonal allergies  Chest pain, unspecified type  Acute nonintractable headache, unspecified headache type   Rx / DC Orders ED Discharge Orders    None       Rayna Sexton, PA-C 05/15/20 1349    Lacretia Leigh, MD 05/16/20 [phone]

## 2020-05-15 NOTE — ED Triage Notes (Signed)
Pressure headache started Sat. Today nosebleed, last night chest pain.

## 2020-05-29 DIAGNOSIS — Z8759 Personal history of other complications of pregnancy, childbirth and the puerperium: Secondary | ICD-10-CM | POA: Insufficient documentation

## 2020-06-14 DIAGNOSIS — N393 Stress incontinence (female) (male): Secondary | ICD-10-CM

## 2020-06-14 HISTORY — DX: Stress incontinence (female) (male): N39.3

## 2020-07-17 ENCOUNTER — Encounter (HOSPITAL_COMMUNITY): Payer: Self-pay

## 2020-07-17 ENCOUNTER — Emergency Department (HOSPITAL_COMMUNITY): Payer: Medicaid Other

## 2020-07-17 ENCOUNTER — Emergency Department (HOSPITAL_COMMUNITY)
Admission: EM | Admit: 2020-07-17 | Discharge: 2020-07-17 | Disposition: A | Discharge status: Home / Self Care | Payer: Medicaid Other | Attending: Emergency Medicine | Admitting: Emergency Medicine

## 2020-07-17 ENCOUNTER — Other Ambulatory Visit: Payer: Self-pay

## 2020-07-17 DIAGNOSIS — Z5321 Procedure and treatment not carried out due to patient leaving prior to being seen by health care provider: Secondary | ICD-10-CM | POA: Insufficient documentation

## 2020-07-17 DIAGNOSIS — O26872 Cervical shortening, second trimester: Secondary | ICD-10-CM | POA: Diagnosis not present

## 2020-07-17 DIAGNOSIS — R079 Chest pain, unspecified: Secondary | ICD-10-CM | POA: Insufficient documentation

## 2020-07-17 DIAGNOSIS — Z3A19 19 weeks gestation of pregnancy: Secondary | ICD-10-CM | POA: Diagnosis not present

## 2020-07-17 DIAGNOSIS — O4692 Antepartum hemorrhage, unspecified, second trimester: Secondary | ICD-10-CM | POA: Diagnosis not present

## 2020-07-17 LAB — BASIC METABOLIC PANEL
Anion gap: 10 (ref 5–15)
BUN: 6 mg/dL (ref 6–20)
CO2: 20 mmol/L — ABNORMAL LOW (ref 22–32)
Calcium: 9.1 mg/dL (ref 8.9–10.3)
Chloride: 105 mmol/L (ref 98–111)
Creatinine, Ser: 0.69 mg/dL (ref 0.44–1.00)
GFR calc Af Amer: >60 mL/min (ref >60)
GFR calc non Af Amer: >60 mL/min (ref >60)
Glucose, Bld: 105 mg/dL — ABNORMAL HIGH (ref 70–99)
Potassium: 3.9 mmol/L (ref 3.5–5.1)
Sodium: 135 mmol/L (ref 135–145)

## 2020-07-17 LAB — CBC
HCT: 37.1 % (ref 36.0–46.0)
Hemoglobin: 12.2 g/dL (ref 12.0–15.0)
MCH: 27.6 pg (ref 26.0–34.0)
MCHC: 32.9 g/dL (ref 30.0–36.0)
MCV: 83.9 fL (ref 80.0–100.0)
Platelets: 106 10*3/uL — ABNORMAL LOW (ref 150–400)
RBC: 4.42 MIL/uL (ref 3.87–5.11)
RDW: 13.4 % (ref 11.5–15.5)
WBC: 11.7 10*3/uL — ABNORMAL HIGH (ref 4.0–10.5)
nRBC: 0 % (ref 0.0–0.2)

## 2020-07-17 LAB — TROPONIN I (HIGH SENSITIVITY): Troponin I (High Sensitivity): <2 ng/L (ref <18)

## 2020-07-17 LAB — I-STAT BETA HCG BLOOD, ED (NOT ORDERABLE): I-stat hCG, quantitative: >2000 m[IU]/mL — ABNORMAL HIGH (ref <5)

## 2020-07-17 MED ORDER — SODIUM CHLORIDE 0.9% FLUSH: 3.0000 mL | Freq: Once | INTRAVENOUS | Status: DC

## 2020-07-17 NOTE — ED Triage Notes (Signed)
Patient arrived stating that she is having central chest pain that started about 40 minutes ago. Patient declines any NVD or dizziness. Reports having heartburn and leg spasms due to her pregnancy but stating this does not feel like heartburn.

## 2020-07-18 ENCOUNTER — Other Ambulatory Visit: Payer: Self-pay

## 2020-07-18 ENCOUNTER — Emergency Department (HOSPITAL_COMMUNITY)
Admission: EM | Admit: 2020-07-18 | Discharge: 2020-07-18 | Discharge status: Against Medical Advice | Payer: Medicaid Other

## 2020-07-18 ENCOUNTER — Inpatient Hospital Stay (HOSPITAL_COMMUNITY)
Admission: AD | Admit: 2020-07-18 | Discharge: 2020-07-18 | Disposition: A | Discharge status: Home / Self Care | Payer: Medicaid Other | Attending: Obstetrics and Gynecology | Admitting: Obstetrics and Gynecology

## 2020-07-18 ENCOUNTER — Encounter (HOSPITAL_COMMUNITY): Payer: Self-pay | Admitting: Obstetrics and Gynecology

## 2020-07-18 ENCOUNTER — Inpatient Hospital Stay (HOSPITAL_BASED_OUTPATIENT_CLINIC_OR_DEPARTMENT_OTHER): Payer: Medicaid Other

## 2020-07-18 DIAGNOSIS — O4692 Antepartum hemorrhage, unspecified, second trimester: Secondary | ICD-10-CM

## 2020-07-18 DIAGNOSIS — Z882 Allergy status to sulfonamides status: Secondary | ICD-10-CM | POA: Diagnosis not present

## 2020-07-18 DIAGNOSIS — Z3A19 19 weeks gestation of pregnancy: Secondary | ICD-10-CM | POA: Diagnosis not present

## 2020-07-18 DIAGNOSIS — Z79899 Other long term (current) drug therapy: Secondary | ICD-10-CM | POA: Insufficient documentation

## 2020-07-18 DIAGNOSIS — O09212 Supervision of pregnancy with history of pre-term labor, second trimester: Secondary | ICD-10-CM | POA: Insufficient documentation

## 2020-07-18 DIAGNOSIS — Z791 Long term (current) use of non-steroidal anti-inflammatories (NSAID): Secondary | ICD-10-CM | POA: Insufficient documentation

## 2020-07-18 DIAGNOSIS — Z3686 Encounter for antenatal screening for cervical length: Secondary | ICD-10-CM

## 2020-07-18 DIAGNOSIS — O26872 Cervical shortening, second trimester: Secondary | ICD-10-CM | POA: Insufficient documentation

## 2020-07-18 DIAGNOSIS — O10912 Unspecified pre-existing hypertension complicating pregnancy, second trimester: Secondary | ICD-10-CM | POA: Insufficient documentation

## 2020-07-18 LAB — CBC
HCT: 36.7 % (ref 36.0–46.0)
Hemoglobin: 11.9 g/dL — ABNORMAL LOW (ref 12.0–15.0)
MCH: 26.8 pg (ref 26.0–34.0)
MCHC: 32.4 g/dL (ref 30.0–36.0)
MCV: 82.7 fL (ref 80.0–100.0)
Platelets: UNDETERMINED 10*3/uL (ref 150–400)
RBC: 4.44 MIL/uL (ref 3.87–5.11)
RDW: 13.4 % (ref 11.5–15.5)
WBC: 10 10*3/uL (ref 4.0–10.5)
nRBC: 0 % (ref 0.0–0.2)

## 2020-07-18 LAB — KLEIHAUER-BETKE STAIN
# Vials RhIg: 1
Fetal Cells %: 0 %
Quantitation Fetal Hemoglobin: 0 mL

## 2020-07-18 MED ORDER — RHO D IMMUNE GLOBULIN 1500 UNIT/2ML IJ SOSY
300.0000 ug | PREFILLED_SYRINGE | Freq: Once | INTRAMUSCULAR | Status: AC
  Administered 2020-07-18: 300 ug via INTRAMUSCULAR
  Filled 2020-07-18: qty 2

## 2020-07-18 NOTE — Discharge Instructions (Signed)
Vaginal Bleeding During Pregnancy, Second Trimester  A small amount of bleeding (spotting) from the vagina is common during pregnancy. Sometimes the bleeding is normal and is not a sign of problems. In some other cases, it is a sign of something serious. Tell your doctor right away if there is any bleeding from your vagina. Follow these instructions at home: Activity  Follow your doctor's instructions about how active you can be.  If needed, make plans for someone to help with your normal activities.  Do not exercise or do activities that take a lot of effort until your doctor says that this is safe.  Do not lift anything that is heavier than 10 lb (4.5 kg) until your doctor says that this is safe.  Do not have sex or orgasms until your doctor says that this is safe. Medicines  Take over-the-counter and prescription medicines only as told by your doctor.  Do not take aspirin. It can cause bleeding. General instructions  Watch your condition for any changes.  Write down: ? The number of pads you use each day. ? How often you change pads. ? How soaked your pads are.  Do not use tampons.  Do not douche.  If you pass any tissue from your vagina, save it to show to your doctor.  Keep all follow-up visits as told by your doctor. This is important. Contact a doctor if:  You have bleeding in the vagina at any time during pregnancy.  You have cramps.  You have a fever that does not get better with medicine. Get help right away if:  You have very bad cramps in your back or belly (abdomen).  You have contractions.  You have chills.  You pass large clots or a lot of tissue from your vagina.  Your bleeding gets worse.  You feel light-headed.  You feel weak.  You pass out (faint).  You are leaking fluid from your vagina.  You have a gush of fluid from your vagina. Summary  Sometimes vaginal bleeding during pregnancy is normal and is not a problem. Sometimes it may  be a sign of something serious.  Tell your doctor about any bleeding from your vagina right away.  Follow your doctor's instructions about how active you can be. You may need someone to help you with your normal activities. This information is not intended to replace advice given to you by your health care provider. Make sure you discuss any questions you have with your health care provider. Document Revised: 03/30/2019 Document Reviewed: 03/12/2017 Elsevier Patient Education  2020 Elsevier Inc.  

## 2020-07-18 NOTE — MAU Provider Note (Signed)
Chief Complaint: Vaginal Bleeding   First Provider Initiated Contact with Patient 07/18/20 1618      SUBJECTIVE HPI: Patricia Valencia is a 34 y.o. [id number] at [redacted]w[redacted]d by 5 wk [place] who presents to maternity admissions reporting vaginal bleeding. Follows with [facility] in [place]. Reports dark blood when she pulled her underwear down to use the restroom. Noted on small part of underwear but did not soak through. She has not used the restroom since to know if she is still bleeding. Denies pain or cramping, maybe some abdominal tightness. Last intercourse in March. Sometimes she does some heavy lifting at work; works in a geriatric center and helps move patients. Denies any trauma. Reports hx of chlamydia many years ago; none since. Denies history of preterm labor or cone biopsy. She denies vaginal itching/burning, urinary symptoms, h/a, dizziness, n/v, or fever/chills.    Past Medical History:  Diagnosis Date  . Hypertension    Gestational   Past Surgical History:  Procedure Laterality Date  . NO PAST SURGERIES     Social History   Socioeconomic History  . Marital status: Single    Spouse name: Not on file  . Number of children: Not on file  . Years of education: Not on file  . Highest education level: Not on file  Occupational History  . Not on file  Tobacco Use  . Smoking status: Never Smoker  . Smokeless tobacco: Never Used  Vaping Use  . Vaping Use: Never used  Substance and Sexual Activity  . Alcohol use: Never  . Drug use: Never  . Sexual activity: Yes  Other Topics Concern  . Not on file  Social History Narrative  . Not on file   Social Determinants of Health   Financial Resource Strain:   . Difficulty of Paying Living Expenses:   Food Insecurity:   . Worried About Charity fundraiser in the Last Year:   . Arboriculturist in the Last Year:   Transportation Needs:   . Film/video editor (Medical):   Marland Kitchen Lack of Transportation (Non-Medical):   Physical Activity:   .  Days of Exercise per Week:   . Minutes of Exercise per Session:   Stress:   . Feeling of Stress :   Social Connections:   . Frequency of Communication with Friends and Family:   . Frequency of Social Gatherings with Friends and Family:   . Attends Religious Services:   . Active Member of Clubs or Organizations:   . Attends Archivist Meetings:   Marland Kitchen Marital Status:   Intimate Partner Violence:   . Fear of Current or Ex-Partner:   . Emotionally Abused:   Marland Kitchen Physically Abused:   . Sexually Abused:    No current facility-administered medications on file prior to encounter.   Current Outpatient Medications on File Prior to Encounter  Medication Sig Dispense Refill  . Prenatal Vit-Fe Fumarate-FA (PRENATAL MULTIVITAMIN) TABS tablet Take 1 tablet by mouth daily at 12 noon.    Marland Kitchen ibuprofen (ADVIL) 400 MG tablet Take 400 mg by mouth every 6 (six) hours as needed for fever, headache or moderate pain.      Allergies  Allergen Reactions  . Sulfa Antibiotics Swelling    Facial swelling    ROS:  Review of Systems All other systems negative unless noted above in HPI.   I have reviewed patient's Past Medical Hx, Surgical Hx, Family Hx, Social Hx, medications and allergies.   Physical Exam  Patient Vitals for the past 24 hrs:  BP Temp Temp src Pulse Resp SpO2 Height Weight  07/18/20 2037 123/75 -- -- 80 -- -- -- --  07/18/20 1845 (!) 125/64 98.1 F (36.7 C) Oral 84 18 100 % -- --  07/18/20 1545 122/74 98.9 F (37.2 C) Oral 87 20 99 % 5\' 3"  (1.6 m) 72.1 kg   Constitutional: Well-developed, well-nourished female in no acute distress.  Cardiovascular: normal rate Respiratory: normal effort GI: Abd soft, non-tender.  MS: Extremities nontender, no edema, normal ROM Neurologic: Alert and oriented x 4.  GU: Neg CVAT. PELVIC EXAM: Cervix pink, visually closed, without lesion, scant white creamy discharge, vaginal walls and external genitalia normal. No blood noted.  Bimanual exam:  Cervix 0/long/high, firm, anterior, neg CMT, uterus nontender, nonenlarged, adnexa without tenderness, enlargement, or mass  FHT 140 by doppler  LAB RESULTS Results for orders placed or performed during the hospital encounter of 07/18/20 (from the past 24 hour(s))  CBC     Status: Abnormal   Collection Time: 07/18/20  3:56 PM  Result Value Ref Range   WBC 10.0 4.0 - 10.5 K/uL   RBC 4.44 3.87 - 5.11 MIL/uL   Hemoglobin 11.9 (L) 12.0 - 15.0 g/dL   HCT 36.7 36 - 46 %   MCV 82.7 80.0 - 100.0 fL   MCH 26.8 26.0 - 34.0 pg   MCHC 32.4 30.0 - 36.0 g/dL   RDW 13.4 11.5 - 15.5 %   Platelets PLATELET CLUMPS NOTED ON SMEAR, UNABLE TO ESTIMATE 150 - 400 K/uL   nRBC 0.0 0.0 - 0.2 %  Kleihauer-Betke stain     Status: None   Collection Time: 07/18/20  3:56 PM  Result Value Ref Range   Fetal Cells % 0 %   Quantitation Fetal Hemoglobin 0 mL   # Vials RhIg 1   Rh IG workup (includes ABO/Rh)     Status: None (Preliminary result)   Collection Time: 07/18/20  3:56 PM  Result Value Ref Range   Gestational Age(Wks) 19.3    ABO/RH(D) A NEG    Antibody Screen NEG    Unit Number [id number]    Blood Component Type RHIG    Unit division 00    Status of Unit ISSUED    Transfusion Status      OK TO TRANSFUSE Performed at [facility], [place] [address], [place], [place] [address]     --/--/A NEG (07/27 1556)  IMAGING DG Chest 2 View  Result Date: 07/17/2020 CLINICAL DATA:  Chest pain EXAM: CHEST - 2 VIEW COMPARISON:  05/15/2020 FINDINGS: The heart size and mediastinal contours are within normal limits. Both lungs are clear. The visualized skeletal structures are unremarkable. IMPRESSION: No active cardiopulmonary disease. Electronically Signed   By: Inez Catalina M.D.   On: 07/17/2020 00:59    MAU Management/MDM: Orders Placed This Encounter  Procedures  . [place] MFM OB Transvaginal  . CBC  . Kleihauer-Betke stain  . Rh IG workup (includes ABO/Rh)  . Discharge patient    Meds ordered  this encounter  Medications  . rho (d) immune globulin (RHIG/RHOPHYLAC) injection 300 mcg     ASSESSMENT 1. Vaginal bleeding in pregnancy, second trimester   2. Encounter for antenatal screening for cervical length   3. Short cervical length during pregnancy, second trimester    Patient presented for vaginal bleeding in second trimester with one episode earlier today. No blood noted on speculum exam. [place] negative for signs of  abruption and no previa. CL 2.0-2.8 cm. CBC WNL, KB negative.   Patient with history of delivery at 34 weeks but for IOL for Pre-E.  Possible cervical funneling noted by [place] tech on [place]. D/w Dr. Annamaria Boots by phone. He reports that without history of PTL, no need for intervention tonight but patient needs repeat cervical length in one week. Message sent to Dr. Annamaria Boots to schedule for repeat US in one week.  Patient given Rhogam for A neg blood type.  Pt discharged with strict return precautions.  PLAN Discharge home with return precautions. F/u in one week with MFM.  Allergies as of 07/18/2020      Reactions   Sulfa Antibiotics Swelling   Facial swelling      Medication List    STOP taking these medications   ibuprofen 400 MG tablet Commonly known as: ADVIL     TAKE these medications   prenatal multivitamin Tabs tablet Take 1 tablet by mouth daily at 12 noon.       Follow-up Information    [facility] Follow up.   Specialty: Maternal and Fetal Medicine Contact information: [address], [place] [id number] [place] [phone] [place], Shidler Fellow, [facility] for [facility], [facility] 07/18/2020  8:49 PM

## 2020-07-18 NOTE — MAU Note (Signed)
Saw dark blood in panties around 1430.  Didn't look right, called dr in [place], was told to get seen. Not really having pain.  Was dx with subchorionic hemorrhage early in preg, has not had any bleeding in wks, had [place] scheduled 8/6.

## 2020-07-19 ENCOUNTER — Other Ambulatory Visit: Payer: Self-pay | Admitting: *Deleted

## 2020-07-19 DIAGNOSIS — O26879 Cervical shortening, unspecified trimester: Secondary | ICD-10-CM

## 2020-07-19 LAB — RH IG WORKUP (INCLUDES ABO/RH)
ABO/RH(D): A NEG
Antibody Screen: NEGATIVE
Gestational Age(Wks): 19.3
Unit division: 0

## 2020-07-19 LAB — GC/CHLAMYDIA PROBE AMP (~~LOC~~) NOT AT ARMC
Chlamydia: NEGATIVE
Comment: NEGATIVE
Comment: NORMAL
Neisseria Gonorrhea: NEGATIVE

## 2020-07-24 ENCOUNTER — Ambulatory Visit: Payer: Medicaid Other

## 2020-07-24 ENCOUNTER — Ambulatory Visit: Payer: Medicaid Other | Attending: Family Medicine

## 2020-08-17 ENCOUNTER — Ambulatory Visit (INDEPENDENT_AMBULATORY_CARE_PROVIDER_SITE_OTHER): Payer: Medicaid Other | Admitting: Podiatry

## 2020-08-17 ENCOUNTER — Other Ambulatory Visit: Payer: Self-pay

## 2020-08-17 ENCOUNTER — Encounter: Payer: Self-pay | Admitting: Podiatry

## 2020-08-17 VITALS — BP 117/73 | HR 83

## 2020-08-17 DIAGNOSIS — M7662 Achilles tendinitis, left leg: Secondary | ICD-10-CM | POA: Diagnosis not present

## 2020-08-17 DIAGNOSIS — M2042 Other hammer toe(s) (acquired), left foot: Secondary | ICD-10-CM

## 2020-08-17 DIAGNOSIS — B351 Tinea unguium: Secondary | ICD-10-CM

## 2020-08-17 DIAGNOSIS — M2041 Other hammer toe(s) (acquired), right foot: Secondary | ICD-10-CM

## 2020-08-17 DIAGNOSIS — M7661 Achilles tendinitis, right leg: Secondary | ICD-10-CM | POA: Diagnosis not present

## 2020-08-17 DIAGNOSIS — M2032 Hallux varus (acquired), left foot: Secondary | ICD-10-CM

## 2020-08-17 DIAGNOSIS — M2031 Hallux varus (acquired), right foot: Secondary | ICD-10-CM

## 2020-08-17 NOTE — Patient Instructions (Signed)
Tuli's Heel Cups   [facility] at [facility] [phone], [place] [address]    Ask your OB about Voltaren gel usage

## 2020-08-18 NOTE — Progress Notes (Signed)
  Subjective:  Patient ID: Patricia Valencia, female    DOB: 03/26/1986,  MRN: [id number]  Chief Complaint  Patient presents with  . Foot Pain    Bilateral foot pain  . Nail Problem     right great toenail black (possible fungus)     34 y.o. female presents with the above complaint. History confirmed with patient.  The toenail has been changing colors progressively.  She also inquires about contracted toes of the lesser toes in the great toes bilaterally.  This causes discomfort wearing shoe gear and while walking.  She also has pain in the posterior heel and into the calf bilaterally  Objective:  Physical Exam: warm, good capillary refill, no trophic changes or ulcerative lesions, normal DP and PT pulses and normal sensory exam.  Right hallux nail with brown-yellow discoloration, appears consistent with onychomycosis. Digital contractures with prominent extensor tendons and hallux malleus, plantarflexed first ray bilaterally.  Bilateral pain on palpation to the Achilles tendon insertion, none in the mid substance, this worsens with resisted plantarflexion  Assessment:   1. Achilles tendinitis, right leg   2. Achilles tendinitis, left leg   3. Onychomycosis      Plan:  Patient was evaluated and treated and all questions answered.   -XR reviewed with patient -Educated on stretching and icing of the affected limb. -Referral placed to physical therapy.  -I discussed with her that we are unfortunately quite limited in the number of modalities we can treat this with, she cannot currently take oral NSAID's.  Voltaren use is not recommended in pregnancy, although I encouraged her to discuss this with her obstetrician.  I would not recommend a steroid injection at the Achilles insertion.  -The onychomycosis, again we are limited in treatment options.  I discussed with her in general health antifungal therapy works with topical, oral, and laser therapy.  She could proceed with laser therapy  during her pregnancy.  Right now, it does not bother her that much other than the appearance and she is planning not to breast-feed, so we decided that she will return in January after she delivers her baby and begin using terbinafine  - For digital contractures and hallux malleus, I do think this contributes somewhat to her deformity and discomfort.  I discussed with her what surgical options are available to treat this, and we will consider this in the future and take x-rays when we need to.  No follow-ups on file.

## 2020-09-12 ENCOUNTER — Encounter (HOSPITAL_COMMUNITY): Payer: Self-pay | Admitting: Obstetrics & Gynecology

## 2020-09-12 ENCOUNTER — Inpatient Hospital Stay (HOSPITAL_COMMUNITY)
Admission: AD | Admit: 2020-09-12 | Discharge: 2020-09-12 | Disposition: A | Discharge status: Home / Self Care | Payer: Medicaid Other | Attending: Obstetrics & Gynecology | Admitting: Obstetrics & Gynecology

## 2020-09-12 ENCOUNTER — Other Ambulatory Visit: Payer: Self-pay

## 2020-09-12 DIAGNOSIS — R102 Pelvic and perineal pain: Secondary | ICD-10-CM | POA: Diagnosis not present

## 2020-09-12 DIAGNOSIS — Z3A27 27 weeks gestation of pregnancy: Secondary | ICD-10-CM | POA: Insufficient documentation

## 2020-09-12 DIAGNOSIS — Z79899 Other long term (current) drug therapy: Secondary | ICD-10-CM | POA: Diagnosis not present

## 2020-09-12 DIAGNOSIS — O99352 Diseases of the nervous system complicating pregnancy, second trimester: Secondary | ICD-10-CM | POA: Diagnosis not present

## 2020-09-12 DIAGNOSIS — R519 Headache, unspecified: Secondary | ICD-10-CM | POA: Diagnosis not present

## 2020-09-12 DIAGNOSIS — O26899 Other specified pregnancy related conditions, unspecified trimester: Secondary | ICD-10-CM

## 2020-09-12 DIAGNOSIS — O26892 Other specified pregnancy related conditions, second trimester: Secondary | ICD-10-CM | POA: Insufficient documentation

## 2020-09-12 DIAGNOSIS — G44209 Tension-type headache, unspecified, not intractable: Secondary | ICD-10-CM | POA: Insufficient documentation

## 2020-09-12 DIAGNOSIS — Z20822 Contact with and (suspected) exposure to covid-19: Secondary | ICD-10-CM | POA: Diagnosis not present

## 2020-09-12 DIAGNOSIS — R05 Cough: Secondary | ICD-10-CM | POA: Diagnosis not present

## 2020-09-12 DIAGNOSIS — O99891 Other specified diseases and conditions complicating pregnancy: Secondary | ICD-10-CM

## 2020-09-12 DIAGNOSIS — R059 Cough, unspecified: Secondary | ICD-10-CM

## 2020-09-12 LAB — SARS CORONAVIRUS 2 (TAT 6-24 HRS): SARS Coronavirus 2: NEGATIVE

## 2020-09-12 LAB — URINALYSIS, ROUTINE W REFLEX MICROSCOPIC
Bilirubin Urine: NEGATIVE
Glucose, UA: NEGATIVE mg/dL
Hgb urine dipstick: NEGATIVE
Ketones, ur: 20 mg/dL — AB
Leukocytes,Ua: NEGATIVE
Nitrite: NEGATIVE
Protein, ur: NEGATIVE mg/dL
Specific Gravity, Urine: 1.015 (ref 1.005–1.030)
pH: 6 (ref 5.0–8.0)

## 2020-09-12 LAB — COMPREHENSIVE METABOLIC PANEL
ALT: 12 U/L (ref 0–44)
AST: 15 U/L (ref 15–41)
Albumin: 2.8 g/dL — ABNORMAL LOW (ref 3.5–5.0)
Alkaline Phosphatase: 70 U/L (ref 38–126)
Anion gap: 10 (ref 5–15)
BUN: 5 mg/dL — ABNORMAL LOW (ref 6–20)
CO2: 19 mmol/L — ABNORMAL LOW (ref 22–32)
Calcium: 9.2 mg/dL (ref 8.9–10.3)
Chloride: 105 mmol/L (ref 98–111)
Creatinine, Ser: 0.62 mg/dL (ref 0.44–1.00)
GFR calc Af Amer: >60 mL/min (ref >60)
GFR calc non Af Amer: >60 mL/min (ref >60)
Glucose, Bld: 90 mg/dL (ref 70–99)
Potassium: 3.6 mmol/L (ref 3.5–5.1)
Sodium: 134 mmol/L — ABNORMAL LOW (ref 135–145)
Total Bilirubin: 0.6 mg/dL (ref 0.3–1.2)
Total Protein: 5.9 g/dL — ABNORMAL LOW (ref 6.5–8.1)

## 2020-09-12 LAB — FETAL FIBRONECTIN: Fetal Fibronectin: NEGATIVE

## 2020-09-12 LAB — GC/CHLAMYDIA PROBE AMP (~~LOC~~) NOT AT ARMC
Chlamydia: NEGATIVE
Comment: NEGATIVE
Comment: NORMAL
Neisseria Gonorrhea: NEGATIVE

## 2020-09-12 LAB — CBC
HCT: 36.5 % (ref 36.0–46.0)
Hemoglobin: 11.7 g/dL — ABNORMAL LOW (ref 12.0–15.0)
MCH: 26.8 pg (ref 26.0–34.0)
MCHC: 32.1 g/dL (ref 30.0–36.0)
MCV: 83.7 fL (ref 80.0–100.0)
Platelets: 93 10*3/uL — ABNORMAL LOW (ref 150–400)
RBC: 4.36 MIL/uL (ref 3.87–5.11)
RDW: 13.5 % (ref 11.5–15.5)
WBC: 17.8 10*3/uL — ABNORMAL HIGH (ref 4.0–10.5)
nRBC: 0 % (ref 0.0–0.2)

## 2020-09-12 LAB — WET PREP, GENITAL
Clue Cells Wet Prep HPF POC: NONE SEEN
Sperm: NONE SEEN
Trich, Wet Prep: NONE SEEN
Yeast Wet Prep HPF POC: NONE SEEN

## 2020-09-12 LAB — PROTEIN / CREATININE RATIO, URINE
Creatinine, Urine: 106.88 mg/dL
Protein Creatinine Ratio: 0.08 mg/mg{Cre} (ref 0.00–0.15)
Total Protein, Urine: 9 mg/dL

## 2020-09-12 MED ORDER — BUTALBITAL-APAP-CAFFEINE 50-325-40 MG PO TABS
1.0000 | ORAL_TABLET | Freq: Once | ORAL | Status: AC
  Administered 2020-09-12: 1 via ORAL
  Filled 2020-09-12: qty 1

## 2020-09-12 NOTE — MAU Note (Signed)
Patricia Valencia is a 34 y.o. at [redacted]w[redacted]d here in MAU reporting: headache and pelvic pressure, pt has cerclage, pt reports chest discomfort when breathing "feels like a cold" Denies VB/LOF +FM  Onset of complaint: this morning

## 2020-09-12 NOTE — MAU Provider Note (Signed)
History     CSN: [id number]  Arrival date and time: 09/12/20 7425   First Provider Initiated Contact with Patient 09/12/20 0505      Chief Complaint  Patient presents with  . Headache  . Pelvic Pain   33 y.o. [id number] @27 .2 wks presenting with cough, HA, and pelvic pressure. Reports onset of cough and HA last night. HA is frontal and rates 7/10. Has not taken anything for it. Cough is non-productive. Associated sx are chest pressure/congestion. Denies fevers. No sick contacts. Her pregnancy is complicated by cervical shortening and cerclage. She was getting care near [place] but lives here now and hasn't been seen in over 1 month. Her kids were recently tested for Covid and negative. She has received one Covid vaccine and is due for the second next week.   OB History    Gravida  5   Para  3   Term  2   Preterm  1   AB  1   Living  3     SAB  0   TAB  1   Ectopic  0   Multiple  0   Live Births  3           Past Medical History:  Diagnosis Date  . Hypertension    Gestational    Past Surgical History:  Procedure Laterality Date  . NO PAST SURGERIES      Family History  Problem Relation Age of Onset  . Hypertension Mother   . Hypertension Father     Social History   Tobacco Use  . Smoking status: Never Smoker  . Smokeless tobacco: Never Used  Vaping Use  . Vaping Use: Never used  Substance Use Topics  . Alcohol use: Never  . Drug use: Never    Allergies:  Allergies  Allergen Reactions  . Sulfa Antibiotics Swelling    Facial swelling    No medications prior to admission.    Review of Systems  Eyes: Negative for visual disturbance.  Respiratory: Positive for cough. Negative for shortness of breath.   Cardiovascular: Negative for chest pain.  Gastrointestinal: Negative for abdominal pain and constipation.  Genitourinary: Positive for pelvic pain. Negative for dysuria, frequency, urgency, vaginal bleeding and vaginal discharge.   Neurological: Positive for headaches.   Physical Exam   Blood pressure 124/81, pulse 94, temperature 99 F (37.2 C), temperature source Oral, resp. rate 18, last menstrual period 02/18/2020, SpO2 100 %. Patient Vitals for the past 24 hrs:  BP Temp Temp src Pulse Resp SpO2  09/12/20 0713 124/81 -- -- 94 -- --  09/12/20 0452 127/80 -- -- 99 -- --  09/12/20 0444 132/79 99 F (37.2 C) Oral 97 18 100 %   Physical Exam Constitutional:      General: She is not in acute distress.    Appearance: She is well-developed.  HENT:     Head: Normocephalic and atraumatic.  Cardiovascular:     Rate and Rhythm: Normal rate and regular rhythm.     Heart sounds: Normal heart sounds.  Pulmonary:     Effort: Pulmonary effort is normal. No respiratory distress.     Breath sounds: Normal breath sounds. No wheezing, rhonchi or rales.  Abdominal:     General: There is no distension.     Palpations: Abdomen is soft.     Tenderness: There is no abdominal tenderness.  Genitourinary:    Comments: External: no lesions or erythema Vagina: rugated, pink, moist, scant white discharge,  cerclage visualized. Cervix closed/thick  Musculoskeletal:        General: Normal range of motion.     Cervical back: Normal range of motion.  Skin:    General: Skin is warm and dry.  Neurological:     General: No focal deficit present.     Mental Status: She is alert and oriented to person, place, and time.  Psychiatric:        Mood and Affect: Mood normal.        Behavior: Behavior normal.   EFM: 140 bpm, mod variability, + accels, no decels Toco: none  Results for orders placed or performed during the hospital encounter of 09/12/20 (from the past 24 hour(s))  Urinalysis, Routine w reflex microscopic Urine, Clean Catch     Status: Abnormal   Collection Time: 09/12/20  4:52 AM  Result Value Ref Range   Color, Urine YELLOW YELLOW   APPearance CLEAR CLEAR   Specific Gravity, Urine 1.015 1.005 - 1.030   pH 6.0 5.0  - 8.0   Glucose, UA NEGATIVE NEGATIVE mg/dL   Hgb urine dipstick NEGATIVE NEGATIVE   Bilirubin Urine NEGATIVE NEGATIVE   Ketones, ur 20 (A) NEGATIVE mg/dL   Protein, ur NEGATIVE NEGATIVE mg/dL   Nitrite NEGATIVE NEGATIVE   Leukocytes,Ua NEGATIVE NEGATIVE  Wet prep, genital     Status: Abnormal   Collection Time: 09/12/20  5:23 AM   Specimen: Vaginal  Result Value Ref Range   Yeast Wet Prep HPF POC NONE SEEN NONE SEEN   Trich, Wet Prep NONE SEEN NONE SEEN   Clue Cells Wet Prep HPF POC NONE SEEN NONE SEEN   WBC, Wet Prep HPF POC MODERATE (A) NONE SEEN   Sperm NONE SEEN   CBC     Status: Abnormal   Collection Time: 09/12/20  5:33 AM  Result Value Ref Range   WBC 17.8 (H) 4.0 - 10.5 K/uL   RBC 4.36 3.87 - 5.11 MIL/uL   Hemoglobin 11.7 (L) 12.0 - 15.0 g/dL   HCT 36.5 36 - 46 %   MCV 83.7 80.0 - 100.0 fL   MCH 26.8 26.0 - 34.0 pg   MCHC 32.1 30.0 - 36.0 g/dL   RDW 13.5 11.5 - 15.5 %   Platelets 93 (L) 150 - 400 K/uL   nRBC 0.0 0.0 - 0.2 %  Comprehensive metabolic panel     Status: Abnormal   Collection Time: 09/12/20  5:33 AM  Result Value Ref Range   Sodium 134 (L) 135 - 145 mmol/L   Potassium 3.6 3.5 - 5.1 mmol/L   Chloride 105 98 - 111 mmol/L   CO2 19 (L) 22 - 32 mmol/L   Glucose, Bld 90 70 - 99 mg/dL   BUN 5 (L) 6 - 20 mg/dL   Creatinine, Ser 0.62 0.44 - 1.00 mg/dL   Calcium 9.2 8.9 - 10.3 mg/dL   Total Protein 5.9 (L) 6.5 - 8.1 g/dL   Albumin 2.8 (L) 3.5 - 5.0 g/dL   AST 15 15 - 41 U/L   ALT 12 0 - 44 U/L   Alkaline Phosphatase 70 38 - 126 U/L   Total Bilirubin 0.6 0.3 - 1.2 mg/dL   GFR calc non Af Amer >60 >60 mL/min   GFR calc Af Amer >60 >60 mL/min   Anion gap 10 5 - 15  Fetal fibronectin     Status: None   Collection Time: 09/12/20  5:51 AM  Result Value Ref Range   Fetal Fibronectin  NEGATIVE NEGATIVE   MAU Course  Procedures Fioricet  MDM Labs ordered and reviewed. No evidence of PTL or UTI. HA improved with meds. Low suspicion for Covid, results  pending. Stable for discharge home.  Assessment and Plan   1. [redacted] weeks gestation of pregnancy   2. Acute non intractable tension-type headache   3. Cough   4. Pelvic pressure in pregnancy    Discharge home Follow up at [facility] to transfer care- message sent PTL precautions Return precautions  Allergies as of 09/12/2020      Reactions   Sulfa Antibiotics Swelling   Facial swelling      Medication List    STOP taking these medications   famotidine 20 MG tablet Commonly known as: PEPCID   indomethacin 25 MG capsule Commonly known as: INDOCIN   RhoGAM Ultra-Filtered Plus 1500 units Sosy Generic drug: Rho D Immune Globulin     TAKE these medications   aspirin 81 MG EC tablet Adult Low Dose Aspirin 81 mg tablet,delayed release  Take 1 tablet every day by oral route.   escitalopram 5 MG tablet Commonly known as: LEXAPRO Take 5 mg by mouth daily.   esomeprazole 40 MG capsule Commonly known as: NEXIUM Take 40 mg by mouth daily.   prenatal multivitamin Tabs tablet Take 1 tablet by mouth daily at 12 noon.      Julianne Handler, CNM 09/12/2020, 7:50 AM

## 2020-09-12 NOTE — Discharge Instructions (Signed)
Abdominal Pain During Pregnancy  Belly (abdominal) pain is common during pregnancy. There are many possible causes. Most of the time, it is not a serious problem. Other times, it can be a sign that something is wrong with the pregnancy. Always tell your doctor if you have belly pain. Follow these instructions at home:  Do not have sex or put anything in your vagina until your pain goes away completely.  Get plenty of rest until your pain gets better.  Drink enough fluid to keep your pee (urine) pale yellow.  Take over-the-counter and prescription medicines only as told by your doctor.  Keep all follow-up visits as told by your doctor. This is important. Contact a doctor if:  Your pain continues or gets worse after resting.  You have lower belly pain that: ? Comes and goes at regular times. ? Spreads to your back. ? Feels like menstrual cramps.  You have pain or burning when you pee (urinate). Get help right away if:  You have a fever or chills.  You have vaginal bleeding.  You are leaking fluid from your vagina.  You are passing tissue from your vagina.  You throw up (vomit) for more than 24 hours.  You have watery poop (diarrhea) for more than 24 hours.  Your baby is moving less than usual.  You feel very weak or faint.  You have shortness of breath.  You have very bad pain in your upper belly. Summary  Belly (abdominal) pain is common during pregnancy. There are many possible causes.  If you have belly pain during pregnancy, tell your doctor right away.  Keep all follow-up visits as told by your doctor. This is important. This information is not intended to replace advice given to you by your health care provider. Make sure you discuss any questions you have with your health care provider. Document Revised: 03/29/2019 Document Reviewed: 03/13/2017 Elsevier Patient Education  [place].   Tension Headache, Adult A tension headache is pain, pressure,  or aching in your head. Tension headaches can last from 30 minutes to several days. Follow these instructions at home: Managing pain  Take over-the-counter and prescription medicines only as told by your doctor.  When you have a headache, lie down in a dark, quiet room.  If told, put ice on your head and neck: ? Put ice in a plastic bag. ? Place a towel between your skin and the bag. ? Leave the ice on for 20 minutes, 2-3 times a day.  If told, put heat on the back of your neck. Do this as often as your doctor tells you to. Use the kind of heat that your doctor recommends, such as a moist heat pack or a heating pad. ? Place a towel between your skin and the heat. ? Leave the heat on for 20-30 minutes. ? Remove the heat if your skin turns bright red. Eating and drinking  Eat meals on a regular schedule.  Watch how much alcohol you drink: ? If you are a woman and are not pregnant, do not drink more than 1 drink a day. ? If you are a man, do not drink more than 2 drinks a day.  Drink enough fluid to keep your pee (urine) pale yellow.  Do not use a lot of caffeine, or stop using caffeine. Lifestyle  Get enough sleep. Get 7-9 hours of sleep each night. Or get the amount of sleep that your doctor tells you to.  At bedtime, remove  all electronic devices from your room. Examples of electronic devices are computers, phones, and tablets.  Find ways to lessen your stress. Some things that can lessen stress are: ? Exercise. ? Deep breathing. ? Yoga. ? Music. ? Positive thoughts.  Sit up straight. Do not tighten (tense) your muscles.  Do not use any products that have nicotine or tobacco in them, such as cigarettes and e-cigarettes. If you need help quitting, ask your doctor. General instructions   Keep all follow-up visits as told by your doctor. This is important.  Avoid things that can bring on headaches. Keep a journal to find out if certain things bring on headaches. For  example, write down: ? What you eat and drink. ? How much sleep you get. ? Any change to your diet or medicines. Contact a doctor if:  Your headache does not get better.  Your headache comes back.  You have a headache and sounds, light, or smells bother you.  You feel sick to your stomach (nauseous) or you throw up (vomit).  Your stomach hurts. Get help right away if:  You suddenly get a very bad headache along with any of these: ? A stiff neck. ? Feeling sick to your stomach. ? Throwing up. ? Feeling weak. ? Trouble seeing. ? Feeling short of breath. ? A rash. ? Feeling unusually sleepy. ? Trouble speaking. ? Pain in your eye or ear. ? Trouble walking or balancing. ? Feeling like you will pass out (faint). ? Passing out. Summary  A tension headache is pain, pressure, or aching in your head.  Tension headaches can last from 30 minutes to several days.  Lifestyle changes and medicines may help relieve pain. This information is not intended to replace advice given to you by your health care provider. Make sure you discuss any questions you have with your health care provider. Document Revised: 10/06/2019 Document Reviewed: 03/21/2017 Elsevier Patient Education  [place].

## 2020-09-27 ENCOUNTER — Encounter: Payer: Medicaid Other | Admitting: Obstetrics

## 2020-09-28 ENCOUNTER — Encounter: Payer: Medicaid Other | Admitting: Obstetrics

## 2020-09-28 ENCOUNTER — Other Ambulatory Visit: Payer: Medicaid Other

## 2020-09-29 ENCOUNTER — Inpatient Hospital Stay (HOSPITAL_COMMUNITY)
Admission: AD | Admit: 2020-09-29 | Discharge: 2020-09-29 | Disposition: A | Discharge status: Home / Self Care | Payer: Medicaid Other | Attending: Obstetrics & Gynecology | Admitting: Obstetrics & Gynecology

## 2020-09-29 ENCOUNTER — Other Ambulatory Visit: Payer: Self-pay

## 2020-09-29 ENCOUNTER — Encounter (HOSPITAL_COMMUNITY): Payer: Self-pay | Admitting: Obstetrics & Gynecology

## 2020-09-29 DIAGNOSIS — O99283 Endocrine, nutritional and metabolic diseases complicating pregnancy, third trimester: Secondary | ICD-10-CM

## 2020-09-29 DIAGNOSIS — Z3A29 29 weeks gestation of pregnancy: Secondary | ICD-10-CM | POA: Diagnosis not present

## 2020-09-29 DIAGNOSIS — Z882 Allergy status to sulfonamides status: Secondary | ICD-10-CM | POA: Diagnosis not present

## 2020-09-29 DIAGNOSIS — O10913 Unspecified pre-existing hypertension complicating pregnancy, third trimester: Secondary | ICD-10-CM | POA: Insufficient documentation

## 2020-09-29 DIAGNOSIS — O163 Unspecified maternal hypertension, third trimester: Secondary | ICD-10-CM

## 2020-09-29 DIAGNOSIS — O99113 Other diseases of the blood and blood-forming organs and certain disorders involving the immune mechanism complicating pregnancy, third trimester: Secondary | ICD-10-CM | POA: Diagnosis not present

## 2020-09-29 DIAGNOSIS — Z634 Disappearance and death of family member: Secondary | ICD-10-CM | POA: Diagnosis not present

## 2020-09-29 DIAGNOSIS — Z733 Stress, not elsewhere classified: Secondary | ICD-10-CM | POA: Diagnosis not present

## 2020-09-29 DIAGNOSIS — R519 Headache, unspecified: Secondary | ICD-10-CM | POA: Diagnosis present

## 2020-09-29 DIAGNOSIS — Z563 Stressful work schedule: Secondary | ICD-10-CM | POA: Insufficient documentation

## 2020-09-29 DIAGNOSIS — Z7982 Long term (current) use of aspirin: Secondary | ICD-10-CM | POA: Diagnosis not present

## 2020-09-29 DIAGNOSIS — I10 Essential (primary) hypertension: Secondary | ICD-10-CM

## 2020-09-29 DIAGNOSIS — O26893 Other specified pregnancy related conditions, third trimester: Secondary | ICD-10-CM | POA: Diagnosis not present

## 2020-09-29 DIAGNOSIS — Z79899 Other long term (current) drug therapy: Secondary | ICD-10-CM | POA: Diagnosis not present

## 2020-09-29 DIAGNOSIS — D696 Thrombocytopenia, unspecified: Secondary | ICD-10-CM | POA: Diagnosis not present

## 2020-09-29 LAB — URINALYSIS, ROUTINE W REFLEX MICROSCOPIC
Bilirubin Urine: NEGATIVE
Glucose, UA: NEGATIVE mg/dL
Hgb urine dipstick: NEGATIVE
Ketones, ur: NEGATIVE mg/dL
Leukocytes,Ua: NEGATIVE
Nitrite: NEGATIVE
Protein, ur: NEGATIVE mg/dL
Specific Gravity, Urine: 1.008 (ref 1.005–1.030)
pH: 7 (ref 5.0–8.0)

## 2020-09-29 LAB — PROTEIN / CREATININE RATIO, URINE
Creatinine, Urine: 76.31 mg/dL
Protein Creatinine Ratio: 0.09 mg/mg{Cre} (ref 0.00–0.15)
Total Protein, Urine: 7 mg/dL

## 2020-09-29 LAB — CBC
HCT: 35.5 % — ABNORMAL LOW (ref 36.0–46.0)
Hemoglobin: 11.3 g/dL — ABNORMAL LOW (ref 12.0–15.0)
MCH: 26.8 pg (ref 26.0–34.0)
MCHC: 31.8 g/dL (ref 30.0–36.0)
MCV: 84.3 fL (ref 80.0–100.0)
Platelets: 84 10*3/uL — ABNORMAL LOW (ref 150–400)
RBC: 4.21 MIL/uL (ref 3.87–5.11)
RDW: 13.3 % (ref 11.5–15.5)
WBC: 8.8 10*3/uL (ref 4.0–10.5)
nRBC: 0 % (ref 0.0–0.2)

## 2020-09-29 LAB — COMPREHENSIVE METABOLIC PANEL
ALT: 13 U/L (ref 0–44)
AST: 17 U/L (ref 15–41)
Albumin: 2.6 g/dL — ABNORMAL LOW (ref 3.5–5.0)
Alkaline Phosphatase: 85 U/L (ref 38–126)
Anion gap: 11 (ref 5–15)
BUN: 6 mg/dL (ref 6–20)
CO2: 18 mmol/L — ABNORMAL LOW (ref 22–32)
Calcium: 8.9 mg/dL (ref 8.9–10.3)
Chloride: 106 mmol/L (ref 98–111)
Creatinine, Ser: 0.66 mg/dL (ref 0.44–1.00)
GFR calc non Af Amer: >60 mL/min (ref >60)
Glucose, Bld: 95 mg/dL (ref 70–99)
Potassium: 3.5 mmol/L (ref 3.5–5.1)
Sodium: 135 mmol/L (ref 135–145)
Total Bilirubin: 0.4 mg/dL (ref 0.3–1.2)
Total Protein: 5.6 g/dL — ABNORMAL LOW (ref 6.5–8.1)

## 2020-09-29 MED ORDER — BUTALBITAL-APAP-CAFFEINE 50-325-40 MG PO TABS
2.0000 | ORAL_TABLET | Freq: Once | ORAL | Status: AC
  Administered 2020-09-29: 2 via ORAL
  Filled 2020-09-29: qty 2

## 2020-09-29 NOTE — Discharge Instructions (Signed)
General Headache Without Cause A headache is pain or discomfort felt around the head or neck area. The specific cause of a headache may not be found. There are many causes and types of headaches. A few common ones are:  Tension headaches.  Migraine headaches.  Cluster headaches.  Chronic daily headaches. Follow these instructions at home: Watch your condition for any changes. Let your health care provider know about them. Take these steps to help with your condition: Managing pain      Take over-the-counter and prescription medicines only as told by your health care provider.  Lie down in a dark, quiet room when you have a headache.  If directed, put ice on your head and neck area: ? Put ice in a plastic bag. ? Place a towel between your skin and the bag. ? Leave the ice on for 20 minutes, 2-3 times per day.  If directed, apply heat to the affected area. Use the heat source that your health care provider recommends, such as a moist heat pack or a heating pad. ? Place a towel between your skin and the heat source. ? Leave the heat on for 20-30 minutes. ? Remove the heat if your skin turns bright red. This is especially important if you are unable to feel pain, heat, or cold. You may have a greater risk of getting burned.  Keep lights dim if bright lights bother you or make your headaches worse. Eating and drinking  Eat meals on a regular schedule.  If you drink alcohol: ? Limit how much you use to:  0-1 drink a day for women.  0-2 drinks a day for men. ? Be aware of how much alcohol is in your drink. In the [place] one drink equals one 12 oz bottle of beer (355 mL), one 5 oz glass of wine (148 mL), or one 1 oz glass of hard liquor (44 mL).  Stop drinking caffeine, or decrease the amount of caffeine you drink. General instructions   Keep a headache journal to help find out what may trigger your headaches. For example, write down: ? What you eat and drink. ? How much  sleep you get. ? Any change to your diet or medicines.  Try massage or other relaxation techniques.  Limit stress.  Sit up straight, and do not tense your muscles.  Do not use any products that contain nicotine or tobacco, such as cigarettes, e-cigarettes, and chewing tobacco. If you need help quitting, ask your health care provider.  Exercise regularly as told by your health care provider.  Sleep on a regular schedule. Get 7-9 hours of sleep each night, or the amount recommended by your health care provider.  Keep all follow-up visits as told by your health care provider. This is important. Contact a health care provider if:  Your symptoms are not helped by medicine.  You have a headache that is different from the usual headache.  You have nausea or you vomit.  You have a fever. Get help right away if:  Your headache becomes severe quickly.  Your headache gets worse after moderate to intense physical activity.  You have repeated vomiting.  You have a stiff neck.  You have a loss of vision.  You have problems with speech.  You have pain in the eye or ear.  You have muscular weakness or loss of muscle control.  You lose your balance or have trouble walking.  You feel faint or pass out.  You have confusion.  You have a seizure. Summary  A headache is pain or discomfort felt around the head or neck area.  There are many causes and types of headaches. In some cases, the cause may not be found.  Keep a headache journal to help find out what may trigger your headaches. Watch your condition for any changes. Let your health care provider know about them.  Contact a health care provider if you have a headache that is different from the usual headache, or if your symptoms are not helped by medicine.  Get help right away if your headache becomes severe, you vomit, you have a loss of vision, you lose your balance, or you have a seizure. This information is not  intended to replace advice given to you by your health care provider. Make sure you discuss any questions you have with your health care provider. Document Revised: 06/29/2018 Document Reviewed: 06/29/2018 Elsevier Patient Education  [place]. Thrombocytopenia Thrombocytopenia is a condition in which you have a low number of platelets in your blood. Platelets are also called thrombocytes. Platelets are tiny cells in the blood. When you bleed, they clump together at the cut or injury to stop the bleeding. This is called blood clotting. Not having enough platelets can cause bleeding problems. Some cases of thrombocytopenia are mild while others are more severe. What are the causes? This condition may be caused by:  Decreased production of platelets. This may be caused by: ? Aplastic anemia. This is when your bone marrow stops making blood cells. ? Cancer in the bone marrow. ? Certain medicines, including chemotherapy. ? Infection in the bone marrow. ? Drinking a lot of alcohol.  Increased destruction of platelets. This may be caused by: ? Certain immune diseases. ? Certain medicines. ? Certain blood clotting disorders. ? Certain inherited disorders. ? Certain bleeding disorders. ? Pregnancy. ? Having an enlarged spleen (hypersplenism). In hypersplenism, the spleen gathers up platelets from circulation. This means that the platelets are not available to help with blood clotting. The spleen can be enlarged because of cirrhosis or other conditions. What are the signs or symptoms? Symptoms of this condition are the result of poor blood clotting. They will vary depending on how low the platelet counts are. Symptoms may include:  Abnormal bleeding.  Nosebleeds.  Heavy menstrual periods.  Blood in the urine or stool (feces).  A purplish discoloration in the skin (purpura).  Bruising.  A rash that looks like pinpoint, purplish-red spots (petechiae) on the skin and mucous  membranes. How is this diagnosed?  This condition may be diagnosed with blood tests and a physical exam. Sometimes, a sample of bone marrow may be removed to look for the original cells (megakaryocytes) that make platelets. Other tests may be needed depending on the cause. How is this treated? Treatment for this condition depends on the cause. Treatment options may include:  Treatment of another condition that is causing the low platelet count.  Medicines to help protect your platelets from being destroyed.  A replacement (transfusion) of platelets to stop or prevent bleeding.  Surgery to remove the spleen. Follow these instructions at home: Activity  Avoid activities that could cause injury or bruising, and follow instructions about how to prevent falls.  Take extra care not to cut yourself when you shave or when you use scissors, needles, knives, and other tools.  Take extra care to protect yourself from burns when ironing or cooking. General instructions   Check your skin and the inside of  your mouth for bruising or bleeding as told by your health care provider.  Check your spit (sputum), urine, and stool for blood as told by your health care provider.  Do not drink alcohol.  Take over-the-counter and prescription medicines only as told by your health care provider.  Do not take any medicines that have aspirin or NSAIDs in them. These medicines can thin your blood and cause you to bleed more easily.  Tell all your health care providers, including dentists and eye doctors, about your condition. Contact a health care provider if you have:  Unexplained bruising. Get help right away if you have:  Active bleeding from anywhere on your body.  Blood in your sputum, urine, or stool. Summary  Thrombocytopenia is a condition in which you have a low number of platelets in your blood.  Platelets are needed for blood clotting.  Symptoms of this condition are the result of poor  blood clotting and may include abnormal bleeding, nosebleeds, and bruising.  This condition may be diagnosed with blood tests and a physical exam.  Treatment for this condition depends on the cause. This information is not intended to replace advice given to you by your health care provider. Make sure you discuss any questions you have with your health care provider. Document Revised: 09/10/2018 Document Reviewed: 09/10/2018 Elsevier Patient Education  [place].

## 2020-09-29 NOTE — MAU Note (Addendum)
Awoke about 0330 with bad h/a. Call 911 and they came and took my b/p and was 145 [id number]. Denies VB or LOF. Some pelvic pressure. Feels like baby is balling up at times. My son was missing for couple of days but returned yesterday so have been under a lot of stress recently.

## 2020-09-29 NOTE — MAU Provider Note (Signed)
Chief Complaint:  Headache and Hypertension   First Provider Initiated Contact with Patient 09/29/20 0501     HPI: Patricia Valencia is a 34 y.o. [phone] at 18w5dwho presents to maternity admissions reporting headache which woke her up.  Called EMS and they took her BP and it was elevated.  Has had some "balling up" but not painful. . She reports good fetal movement, denies LOF, vaginal bleeding, vaginal itching/burning, urinary symptoms, h/a, dizziness, n/v, diarrhea, constipation or fever/chills.  She denies headache, visual changes or RUQ abdominal pain.  Was getting prenatal care in [place] but recently moved here.  Had new OB this week but missed it due to her missing child. Also lost her husband in January. In school for Nursing and working, so lots of stress  Headache  This is a new problem. The current episode started today. The problem occurs constantly. The problem has been unchanged. The quality of the pain is described as aching. Pertinent negatives include no abdominal pain, back pain, blurred vision, coughing, fever, muscle aches, nausea or vomiting. Nothing aggravates the symptoms. She has tried nothing for the symptoms. Her past medical history is significant for hypertension.  Hypertension This is a new problem. The current episode started today. Associated symptoms include headaches. Pertinent negatives include no blurred vision. There are no associated agents to hypertension. Past treatments include nothing.    RN Note: Awoke about 0330 with bad h/a. Call 911 and they came and took my b/p and was 145 [id number]. Denies VB or LOF. Some pelvic pressure. Feels like baby is balling up at times. My son was missing for couple of days but returned yesterday so have been under a lot of stress recently  Past Medical History: Past Medical History:  Diagnosis Date  . Hypertension    Gestational    Past obstetric history: OB History  Gravida Para Term Preterm AB Living  5 3 2 1 1 3   SAB TAB  Ectopic Multiple Live Births  0 1 0 0 3    # Outcome Date GA Lbr Len/2nd Weight Sex Delivery Anes PTL Lv  5 Current           4 Preterm           3 Term           2 Term           1 TAB             Past Surgical History: Past Surgical History:  Procedure Laterality Date  . CERVICAL CERCLAGE    . NO PAST SURGERIES      Family History: Family History  Problem Relation Age of Onset  . Hypertension Mother   . Hypertension Father     Social History: Social History   Tobacco Use  . Smoking status: Never Smoker  . Smokeless tobacco: Never Used  Vaping Use  . Vaping Use: Never used  Substance Use Topics  . Alcohol use: Never  . Drug use: Never    Allergies:  Allergies  Allergen Reactions  . Sulfa Antibiotics Swelling    Facial swelling    Meds:  Medications Prior to Admission  Medication Sig Dispense Refill Last Dose  . esomeprazole (NEXIUM) 40 MG capsule Take 40 mg by mouth daily.   09/28/2020 at Unknown time  . Prenatal Vit-Fe Fumarate-FA (PRENATAL MULTIVITAMIN) TABS tablet Take 1 tablet by mouth daily at 12 noon.   09/28/2020 at Unknown time  . aspirin 81 MG EC tablet  Adult Low Dose Aspirin 81 mg tablet,delayed release  Take 1 tablet every day by oral route.     Marland Kitchen escitalopram (LEXAPRO) 5 MG tablet Take 5 mg by mouth daily.       I have reviewed patient's Past Medical Hx, Surgical Hx, Family Hx, Social Hx, medications and allergies.   ROS:  Review of Systems  Constitutional: Negative for fever.  Eyes: Negative for blurred vision.  Respiratory: Negative for cough.   Gastrointestinal: Negative for abdominal pain, nausea and vomiting.  Musculoskeletal: Negative for back pain.  Neurological: Positive for headaches.   Other systems negative  Physical Exam   Patient Vitals for the past 24 hrs:  BP Temp Pulse Resp Height Weight  09/29/20 0446 127/85 -- 88 -- -- --  09/29/20 0441 -- 98.2 F (36.8 C) -- 20 5\' 3"  (1.6 m) 73.9 kg   Vitals:   09/29/20 0446  09/29/20 0459 09/29/20 0500 09/29/20 0515  BP: 127/85 128/87 127/88 129/85  Pulse: 88 98 92 87  Resp:      Temp:      Weight:      Height:        Constitutional: Well-developed, well-nourished female in no acute distress.  Cardiovascular: normal rate and rhythm Respiratory: normal effort, clear to auscultation bilaterally GI: Abd soft, non-tender, gravid appropriate for gestational age.   No rebound or guarding. MS: Extremities nontender, no edema, normal ROM Neurologic: Alert and oriented x 4. DTRs 3+ GU: Neg CVAT.  PELVIC EXAM: Deferred  FHT:  Baseline 130 , moderate variability, accelerations present, no decelerations Contractions: none tracing   Labs: Results for orders placed or performed during the hospital encounter of 09/29/20 (from the past 24 hour(s))  Urinalysis, Routine w reflex microscopic Urine, Clean Catch     Status: Abnormal   Collection Time: 09/29/20  4:55 AM  Result Value Ref Range   Color, Urine STRAW (A) YELLOW   APPearance CLEAR CLEAR   Specific Gravity, Urine 1.008 1.005 - 1.030   pH 7.0 5.0 - 8.0   Glucose, UA NEGATIVE NEGATIVE mg/dL   Hgb urine dipstick NEGATIVE NEGATIVE   Bilirubin Urine NEGATIVE NEGATIVE   Ketones, ur NEGATIVE NEGATIVE mg/dL   Protein, ur NEGATIVE NEGATIVE mg/dL   Nitrite NEGATIVE NEGATIVE   Leukocytes,Ua NEGATIVE NEGATIVE  Protein / creatinine ratio, urine     Status: None   Collection Time: 09/29/20  4:55 AM  Result Value Ref Range   Creatinine, Urine 76.31 mg/dL   Total Protein, Urine 7 mg/dL   Protein Creatinine Ratio 0.09 0.00 - 0.15 mg/mg[Cre]  CBC     Status: Abnormal   Collection Time: 09/29/20  5:11 AM  Result Value Ref Range   WBC 8.8 4.0 - 10.5 K/uL   RBC 4.21 3.87 - 5.11 MIL/uL   Hemoglobin 11.3 (L) 12.0 - 15.0 g/dL   HCT 35.5 (L) 36 - 46 %   MCV 84.3 80.0 - 100.0 fL   MCH 26.8 26.0 - 34.0 pg   MCHC 31.8 30.0 - 36.0 g/dL   RDW 13.3 11.5 - 15.5 %   Platelets 84 (L) 150 - 400 K/uL   nRBC 0.0 0.0 - 0.2 %   Comprehensive metabolic panel     Status: Abnormal   Collection Time: 09/29/20  5:11 AM  Result Value Ref Range   Sodium 135 135 - 145 mmol/L   Potassium 3.5 3.5 - 5.1 mmol/L   Chloride 106 98 - 111 mmol/L   CO2 18 (L) 22 -  32 mmol/L   Glucose, Bld 95 70 - 99 mg/dL   BUN 6 6 - 20 mg/dL   Creatinine, Ser 0.66 0.44 - 1.00 mg/dL   Calcium 8.9 8.9 - 10.3 mg/dL   Total Protein 5.6 (L) 6.5 - 8.1 g/dL   Albumin 2.6 (L) 3.5 - 5.0 g/dL   AST 17 15 - 41 U/L   ALT 13 0 - 44 U/L   Alkaline Phosphatase 85 38 - 126 U/L   Total Bilirubin 0.4 0.3 - 1.2 mg/dL   GFR calc non Af Amer >60 >60 mL/min   Anion gap 11 5 - 15    --/--/A NEG (07/27 1556)  Platelet count Trends:   Ref. Range 07/17/2020 00:44 07/18/2020 15:56 09/12/2020 05:33 09/29/2020 05:11  Platelets Latest Ref Range: 150 - 400 K/uL 106 (L) PLATELET CLUMPS NOTED ON SMEAR, UNABLE TO ESTIMATE 93 (L) 84 (L)    Imaging:  No results found.  MAU Course/MDM: I have ordered labs and reviewed results. They are normal except for low platelets.  Upon chart review, they have been diminishing over time.  Patient states has not been worked up for that.  NST reviewed, reassuring Consult Dr Roselie Awkward with presentation, exam findings and test results.  Treatments in MAU included EFM.    Assessment: Single IUP at [redacted]w[redacted]d Intermittent Hypertension with normal BPs here Thrombocytopenia, probably gestational  Headache, likely stress related High Stress, recent loss of husband   Plan: Discharge home Need to reschedule new Ob, message sent Preeclampsia precautions Follow up in Office for prenatal visits and recheck of BP  Encouraged to return here or to other Urgent Care/ED if she develops worsening of symptoms, increase in pain, fever, or other concerning symptoms.  Pt stable at time of discharge.  Hansel Feinstein CNM, MSN Certified Nurse-Midwife 09/29/2020 5:01 AM

## 2020-09-29 NOTE — Progress Notes (Signed)
OK to d/c EFM per Carmelia Roller CNM

## 2020-09-29 NOTE — Progress Notes (Signed)
Patricia Valencia CNM in to discuss test results and d/c plan. Written and verbal d/c instructions given and understanding voiced.  

## 2020-10-11 ENCOUNTER — Other Ambulatory Visit: Payer: Medicaid Other

## 2020-10-11 ENCOUNTER — Ambulatory Visit (INDEPENDENT_AMBULATORY_CARE_PROVIDER_SITE_OTHER): Payer: Medicaid Other | Admitting: Obstetrics

## 2020-10-11 ENCOUNTER — Other Ambulatory Visit: Payer: Self-pay

## 2020-10-11 ENCOUNTER — Encounter: Payer: Self-pay | Admitting: Obstetrics

## 2020-10-11 VITALS — BP 133/83 | HR 90 | Wt 166.0 lb

## 2020-10-11 DIAGNOSIS — O09299 Supervision of pregnancy with other poor reproductive or obstetric history, unspecified trimester: Secondary | ICD-10-CM

## 2020-10-11 DIAGNOSIS — R519 Headache, unspecified: Secondary | ICD-10-CM

## 2020-10-11 DIAGNOSIS — O98513 Other viral diseases complicating pregnancy, third trimester: Secondary | ICD-10-CM | POA: Insufficient documentation

## 2020-10-11 DIAGNOSIS — Z8759 Personal history of other complications of pregnancy, childbirth and the puerperium: Secondary | ICD-10-CM

## 2020-10-11 DIAGNOSIS — O0990 Supervision of high risk pregnancy, unspecified, unspecified trimester: Secondary | ICD-10-CM

## 2020-10-11 DIAGNOSIS — O139 Gestational [pregnancy-induced] hypertension without significant proteinuria, unspecified trimester: Secondary | ICD-10-CM

## 2020-10-11 DIAGNOSIS — Z3A31 31 weeks gestation of pregnancy: Secondary | ICD-10-CM | POA: Diagnosis not present

## 2020-10-11 DIAGNOSIS — O26893 Other specified pregnancy related conditions, third trimester: Secondary | ICD-10-CM

## 2020-10-11 DIAGNOSIS — O26899 Other specified pregnancy related conditions, unspecified trimester: Secondary | ICD-10-CM | POA: Insufficient documentation

## 2020-10-11 DIAGNOSIS — O0993 Supervision of high risk pregnancy, unspecified, third trimester: Secondary | ICD-10-CM | POA: Diagnosis not present

## 2020-10-11 DIAGNOSIS — O98519 Other viral diseases complicating pregnancy, unspecified trimester: Secondary | ICD-10-CM

## 2020-10-11 DIAGNOSIS — M549 Dorsalgia, unspecified: Secondary | ICD-10-CM

## 2020-10-11 DIAGNOSIS — B009 Herpesviral infection, unspecified: Secondary | ICD-10-CM

## 2020-10-11 DIAGNOSIS — Z6791 Unspecified blood type, Rh negative: Secondary | ICD-10-CM

## 2020-10-11 DIAGNOSIS — O3430 Maternal care for cervical incompetence, unspecified trimester: Secondary | ICD-10-CM

## 2020-10-11 DIAGNOSIS — O09293 Supervision of pregnancy with other poor reproductive or obstetric history, third trimester: Secondary | ICD-10-CM

## 2020-10-11 DIAGNOSIS — D696 Thrombocytopenia, unspecified: Secondary | ICD-10-CM

## 2020-10-11 DIAGNOSIS — O3433 Maternal care for cervical incompetence, third trimester: Secondary | ICD-10-CM

## 2020-10-11 DIAGNOSIS — O99119 Other diseases of the blood and blood-forming organs and certain disorders involving the immune mechanism complicating pregnancy, unspecified trimester: Secondary | ICD-10-CM

## 2020-10-11 MED ORDER — BUTALBITAL-APAP-CAFFEINE 50-325-40 MG PO TABS: 1.0000 | ORAL_TABLET | Freq: Four times a day (QID) | ORAL | 2 refills | Status: DC | PRN

## 2020-10-11 MED ORDER — COMFORT FIT MATERNITY SUPP SM MISC: 0 refills | Status: DC

## 2020-10-11 MED ORDER — VALACYCLOVIR HCL 1 G PO TABS: 1000.0000 mg | ORAL_TABLET | Freq: Every day | ORAL | 5 refills | Status: DC

## 2020-10-11 NOTE — Progress Notes (Signed)
Pt has hx of 2 preterm deliveries. Pt has cerclage in place currently.  Pt has been having back pain since cerclage placed,? Anesthesia.  Pt states she does have issues with BP. Pt states she does have a lot going on, she is in school, working and has 3 children at home.    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

## 2020-10-11 NOTE — Progress Notes (Addendum)
Subjective:    Patricia Valencia is being seen today for her first obstetrical visit.  This is not a planned pregnancy. She is at [redacted]w[redacted]d gestation. Her obstetrical history is significant for pregnancy induced hypertension. Relationship with FOB: significant other, not living together. Patient does intend to breast feed. Pregnancy history fully reviewed.  The information documented in the HPI was reviewed and verified.  Menstrual History: OB History    Gravida  5   Para  3   Term  1   Preterm  2   AB  1   Living  3     SAB  0   TAB  1   Ectopic  0   Multiple  0   Live Births  3            Patient's last menstrual period was 02/18/2020 (approximate).    Past Medical History:  Diagnosis Date  . Hypertension    Gestational    Past Surgical History:  Procedure Laterality Date  . CERVICAL CERCLAGE    . NO PAST SURGERIES      (Not in a hospital admission)  Allergies  Allergen Reactions  . Sulfa Antibiotics Swelling    Facial swelling    Social History   Tobacco Use  . Smoking status: Never Smoker  . Smokeless tobacco: Never Used  Substance Use Topics  . Alcohol use: Never    Family History  Problem Relation Age of Onset  . Hypertension Mother   . Hypertension Father      Review of Systems Constitutional: negative for weight loss Gastrointestinal: negative for vomiting Genitourinary:negative for genital lesions and vaginal discharge and dysuria Musculoskeletal: positive for back pain Behavioral/Psych: negative for abusive relationship, depression, illegal drug usage and tobacco use    Objective:    BP 133/83   Pulse 90   Wt 166 lb (75.3 kg)   LMP 02/18/2020 (Approximate)   BMI 29.41 kg/m  General Appearance:    Alert, cooperative, no distress, appears stated age  Head:    Normocephalic, without obvious abnormality, atraumatic  Eyes:    PERRL, conjunctiva/corneas clear, EOM's intact, fundi    benign, both eyes  Ears:    Normal TM's and  external ear canals, both ears  Nose:   Nares normal, septum midline, mucosa normal, no drainage    or sinus tenderness  Throat:   Lips, mucosa, and tongue normal; teeth and gums normal  Neck:   Supple, symmetrical, trachea midline, no adenopathy;    thyroid:  no enlargement/tenderness/nodules; no carotid   bruit or JVD  Back:     Symmetric, no curvature, ROM normal, no CVA tenderness  Lungs:     Clear to auscultation bilaterally, respirations unlabored  Chest Wall:    No tenderness or deformity   Heart:    Regular rate and rhythm, S1 and S2 normal, no murmur, rub   or gallop  Breast Exam:    No tenderness, masses, or nipple abnormality  Abdomen:     Soft, non-tender, bowel sounds active all four quadrants,    no masses, no organomegaly  Genitalia:    Normal female without lesion, discharge or tenderness  Extremities:   Extremities normal, atraumatic, no cyanosis or edema  Pulses:   2+ and symmetric all extremities  Skin:   Skin color, texture, turgor normal, no rashes or lesions  Lymph nodes:   Cervical, supraclavicular, and axillary nodes normal  Neurologic:   CNII-XII intact, normal strength, sensation and reflexes  throughout      Lab Review Urine pregnancy test Labs reviewed yes Radiologic studies reviewed yes  Assessment:    Pregnancy at [redacted]w[redacted]d weeks    Plan:     1. Supervision of high risk pregnancy, antepartum Rx: - Glucose Tolerance, 2 Hours w/1 Hour - CBC - HIV Antibody (routine testing w rflx) - RPR  2. Incompetent cervix in pregnancy, antepartum Rx: - AMB referral to maternal fetal medicine - [place] MFM OB DETAIL +14 WK; Future  3. Cervical cerclage suture present, antepartum Rx: - AMB referral to maternal fetal medicine  4. Gestational thrombocytopenia  Rx: - AMB referral to maternal fetal medicine  5. Gestational hypertension, antepartum - clinically stable.  Not on meds Rx: - AMB referral to maternal fetal medicine  6. History of severe  pre-eclampsia - Baby ASA Rx, but not taking because of thrombocytopenia  7. History of oligohydramnios  8. Prior pregnancy complicated by IUGR, antepartum  9. Herpes simplex type 2 (HSV-2) infection affecting pregnancy, antepartum Rx: - valACYclovir (VALTREX) 1000 MG tablet; Take 1 tablet (1,000 mg total) by mouth daily. For suppression in pregnancy.  Dispense: 30 tablet; Refill: 5  10. Acute nonintractable headache, unspecified headache type Rx: - butalbital-acetaminophen-caffeine (FIORICET) 50-325-40 MG tablet; Take 1-2 tablets by mouth every 6 (six) hours as needed for headache.  Dispense: 30 tablet; Refill: 2  11. Backache symptom Rx: - Elastic Bandages & Supports (COMFORT FIT MATERNITY SUPP SM) MISC; Wear as directed.  Dispense: 1 each; Refill: 0   Prenatal vitamins.  Counseling provided regarding continued use of seat belts, cessation of alcohol consumption, smoking or use of illicit drugs; infection precautions i.e., influenza/TDAP immunizations, toxoplasmosis,CMV, parvovirus, listeria and varicella; workplace safety, exercise during pregnancy; routine dental care, safe medications, sexual activity, hot tubs, saunas, pools, travel, caffeine use, fish and methlymercury, potential toxins, hair treatments, varicose veins Weight gain recommendations per IOM guidelines reviewed: underweight/BMI< 18.5--> gain 28 - 40 lbs; normal weight/BMI 18.5 - 24.9--> gain 25 - 35 lbs; overweight/BMI 25 - 29.9--> gain 15 - 25 lbs; obese/BMI >30->gain  11 - 20 lbs Problem list reviewed and updated. FIRST/CF mutation testing/NIPT/QUAD SCREEN/fragile X/Ashkenazi Jewish population testing/Spinal muscular atrophy discussed: requested. Role of ultrasound in pregnancy discussed; fetal survey: requested. Amniocentesis discussed: not indicated.  Meds ordered this encounter  Medications  . valACYclovir (VALTREX) 1000 MG tablet    Sig: Take 1 tablet (1,000 mg total) by mouth daily. For suppression in  pregnancy.    Dispense:  30 tablet    Refill:  5  . Elastic Bandages & Supports (COMFORT FIT MATERNITY SUPP SM) MISC    Sig: Wear as directed.    Dispense:  1 each    Refill:  0  . butalbital-acetaminophen-caffeine (FIORICET) 50-325-40 MG tablet    Sig: Take 1-2 tablets by mouth every 6 (six) hours as needed for headache.    Dispense:  30 tablet    Refill:  2   Orders Placed This Encounter  Procedures  . [place] MFM OB DETAIL +14 WK    Standing Status:   Future    Standing Expiration Date:   10/11/2021    Order Specific Question:   Reason for Exam (SYMPTOM  OR DIAGNOSIS REQUIRED)    Answer:   Cervical incompetence.    Order Specific Question:   Preferred Location    Answer:   [facility] Ultrasound  . Glucose Tolerance, 2 Hours w/1 Hour  . CBC  . HIV Antibody (routine testing w rflx)  . RPR  .  AMB referral to maternal fetal medicine    Referral Priority:   Routine    Referral Type:   Consultation    Referral Reason:   Specialty Services Required    Number of Visits Requested:   1    Follow up in 1 weeks. 50% of 20 min visit spent on counseling and coordination of care.    Shelly Bombard, MD 10/11/2020 10:42 AM

## 2020-10-12 LAB — CBC
Hematocrit: 34.4 % (ref 34.0–46.6)
Hemoglobin: 11.8 g/dL (ref 11.1–15.9)
MCH: 28.7 pg (ref 26.6–33.0)
MCHC: 34.3 g/dL (ref 31.5–35.7)
MCV: 84 fL (ref 79–97)
Platelets: 81 10*3/uL — CL (ref 150–450)
RBC: 4.11 x10E6/uL (ref 3.77–5.28)
RDW: 13.6 % (ref 11.7–15.4)
WBC: 11.3 10*3/uL — ABNORMAL HIGH (ref 3.4–10.8)

## 2020-10-12 LAB — HIV ANTIBODY (ROUTINE TESTING W REFLEX): HIV Screen 4th Generation wRfx: NONREACTIVE

## 2020-10-12 LAB — RPR: RPR Ser Ql: NONREACTIVE

## 2020-10-12 LAB — GLUCOSE TOLERANCE, 2 HOURS W/ 1HR
Glucose, 1 hour: 132 mg/dL (ref 65–179)
Glucose, 2 hour: 143 mg/dL (ref 65–152)
Glucose, Fasting: 91 mg/dL (ref 65–91)

## 2020-10-13 ENCOUNTER — Other Ambulatory Visit: Payer: Self-pay | Admitting: Obstetrics

## 2020-10-13 DIAGNOSIS — D696 Thrombocytopenia, unspecified: Secondary | ICD-10-CM

## 2020-10-13 DIAGNOSIS — O99119 Other diseases of the blood and blood-forming organs and certain disorders involving the immune mechanism complicating pregnancy, unspecified trimester: Secondary | ICD-10-CM

## 2020-10-19 ENCOUNTER — Other Ambulatory Visit: Payer: Self-pay

## 2020-10-19 ENCOUNTER — Ambulatory Visit (INDEPENDENT_AMBULATORY_CARE_PROVIDER_SITE_OTHER): Payer: Medicaid Other | Admitting: Obstetrics and Gynecology

## 2020-10-19 VITALS — BP 135/76 | HR 77 | Wt 166.0 lb

## 2020-10-19 DIAGNOSIS — O0990 Supervision of high risk pregnancy, unspecified, unspecified trimester: Secondary | ICD-10-CM

## 2020-10-19 DIAGNOSIS — O98513 Other viral diseases complicating pregnancy, third trimester: Secondary | ICD-10-CM

## 2020-10-19 DIAGNOSIS — Z6791 Unspecified blood type, Rh negative: Secondary | ICD-10-CM

## 2020-10-19 DIAGNOSIS — Z8759 Personal history of other complications of pregnancy, childbirth and the puerperium: Secondary | ICD-10-CM

## 2020-10-19 DIAGNOSIS — O09293 Supervision of pregnancy with other poor reproductive or obstetric history, third trimester: Secondary | ICD-10-CM

## 2020-10-19 DIAGNOSIS — R4589 Other symptoms and signs involving emotional state: Secondary | ICD-10-CM | POA: Insufficient documentation

## 2020-10-19 DIAGNOSIS — O26893 Other specified pregnancy related conditions, third trimester: Secondary | ICD-10-CM

## 2020-10-19 DIAGNOSIS — B009 Herpesviral infection, unspecified: Secondary | ICD-10-CM

## 2020-10-19 DIAGNOSIS — Z3A32 32 weeks gestation of pregnancy: Secondary | ICD-10-CM

## 2020-10-19 DIAGNOSIS — O0933 Supervision of pregnancy with insufficient antenatal care, third trimester: Secondary | ICD-10-CM

## 2020-10-19 DIAGNOSIS — O3433 Maternal care for cervical incompetence, third trimester: Secondary | ICD-10-CM

## 2020-10-19 DIAGNOSIS — Z8751 Personal history of pre-term labor: Secondary | ICD-10-CM | POA: Insufficient documentation

## 2020-10-19 DIAGNOSIS — D696 Thrombocytopenia, unspecified: Secondary | ICD-10-CM

## 2020-10-19 NOTE — Patient Instructions (Signed)
Cervical Cerclage  Cervical cerclage is a surgical procedure to correct a cervix that opens up and thins out before pregnancy is at term. This is also called cervical insufficiency, or incompetent cervix. This condition can cause labor to start early (prematurely). In this procedure, a health care provider uses stitches (sutures) to sew the cervix shut during pregnancy. Your health care provider may use ultrasound to help guide the procedure and monitor your baby. Ultrasound uses sound waves to take images of your cervix and uterus. The health care provider will assess these images on a monitor in the operating room. Tell a health care provider about:  Any allergies you have, especially any allergies related to prescribed medicine, stitches, or anesthetic medicines.  Any problems you or family members have had with anesthetic medicines.  Any blood disorders you have.  Any surgeries you have had, including prior cervical stitching.  Any medical conditions you have or have had. What are the risks? Generally, this is a safe procedure. However, problems may occur, including:  Infection, such as infection of the cervix or the bag of fluid that surrounds the baby (amniotic sac).  Vaginal bleeding.  Allergic reactions to medicines.  Damage to nearby structures or organs, such as injury to the cervix or tearing of the amniotic sac.  Contractions that come too early, including going into early labor and delivery.  Cervical dystocia. This occurs when the cervix is unable to open normally during labor. What happens before the procedure? Staying hydrated Follow instructions from your health care provider about hydration, which may include:  Up to 2 hours before the procedure - you may continue to drink clear liquids, such as water, clear fruit juice, black coffee, and plain tea.  Eating and drinking restrictions Follow instructions from your health care provider about eating and drinking,  which may include:  8 hours before the procedure - stop eating heavy meals or foods, such as meat, fried foods, or fatty foods.  6 hours before the procedure - stop eating light meals or foods, such as toast or cereal.  6 hours before the procedure - stop drinking milk or drinks that contain milk.  2 hours before the procedure - stop drinking clear liquids. Medicines Ask your health care provider about:  Changing or stopping your regular medicines. This is especially important if you are taking diabetes medicines or blood thinners.  Taking medicines such as aspirin and ibuprofen. These medicines can thin your blood. Do not take these medicines unless your health care provider tells you to take them.  Taking over-the-counter medicines, vitamins, herbs, and supplements. Surgery safety Ask your health care provider:  How your surgery site will be marked.  What steps will be taken to help prevent infection. These may include: ? Removing hair at the surgery site. ? Washing skin with a germ-killing soap. ? Taking antibiotic medicine. General instructions  Do not put on any lotion, deodorant, or perfume.  Remove contact lenses and jewelry.  You may have an exam or testing, including blood or urine tests.  Plan to have someone take you home from the hospital or clinic.  If you will be going home right after the procedure, plan to have someone with you for 24 hours. What happens during the procedure?  An IV will be inserted into one of your veins.  You may be given one or more of the following: ? A medicine to help you relax (sedative). ? A medicine to numb the area (local anesthetic). ?  A medicine to make you fall asleep (general anesthetic). ? A medicine that is injected into your spine to numb the area below and slightly above the injection site (spinal anesthetic).  A lubricated instrument (speculum) will be inserted into your vagina. The speculum will be widened to open the  walls of your vagina so your surgeon can see your cervix.  Your cervix will be grasped and tightly sutured to close it. To do this, your surgeon will stitch a strong band of thread around your cervix, then the thread will be tightened to hold your cervix shut. The procedure may vary among health care providers and hospitals. What happens after the procedure?  Your blood pressure, heart rate, breathing rate, and blood oxygen level will be monitored until you leave the hospital or clinic.  You will be monitored for premature contractions.  You may have light bleeding and mild cramping.  You may have to wear compression stockings. These stockings help to prevent blood clots and reduce swelling in your legs.  If you were given a sedative during the procedure, it can affect you for several hours. Do not drive or operate machinery until your health care provider says that it is safe.  You may be put on bed rest.  You may be given an injection of a hormone (progesterone) to prevent premature contractions. Summary  Cervical cerclage is a surgical procedure in which stitches are used to sew the cervix shut during pregnancy.  Before the procedure, tell your health care provider about your medicines, or medical problems or blood disorders that you have.  This is a safe procedure. However, problems may occur, including infection, bleeding, or premature labor.  Follow all instructions about eating and drinking before the procedure. Plan to have someone drive you home from the hospital or clinic. This information is not intended to replace advice given to you by your health care provider. Make sure you discuss any questions you have with your health care provider. Document Revised: 10/05/2019 Document Reviewed: 08/04/2019 Elsevier Patient Education  2020 [employer].

## 2020-10-19 NOTE — Progress Notes (Signed)
PRENATAL VISIT NOTE  Subjective:  Patricia Valencia is a 34 y.o. [id number] at [redacted]w[redacted]d being seen today for ongoing prenatal care.  She is currently monitored for the following issues for this high-risk pregnancy and has Female stress incontinence; History of pre-eclampsia; Pregnant; Thrombocytopenia ([place]); Rh negative status during pregnancy; History of oligohydramnios in prior pregnancy, currently pregnant, third trimester; HSV-2 infection complicating pregnancy, third trimester; Cervical cerclage suture present; Supervision of high risk pregnancy, antepartum; [redacted] weeks gestation of pregnancy; Late prenatal care affecting pregnancy in third trimester; History of preterm delivery; and Depressed mood on their problem list.  Patient doing well with no acute concerns today. She reports fatigue.  Contractions: Irritability. Vag. Bleeding: None.  Movement: Present. Denies leaking of fluid.  Pt states headache this morning, but it has resolved.  Pt notes cerclage in place.  This is her first cerclage and it was placed at 23-24 weeks "at [facility]."  I cannot find any records of placement in [place].  Pt notes difficulty dealing with life stressors including her children, the pregnancy, and the loss of her spouse.  She states she has no energy and feels depressed.  The pt is requesting counseling assistance.  The following portions of the patient's history were reviewed and updated as appropriate: allergies, current medications, past family history, past medical history, past social history, past surgical history and problem list. Problem list updated.  Objective:   Vitals:   10/19/20 1113 10/19/20 1146  BP: (!) 144/92 135/76  Pulse: 75 77  Weight: 166 lb (75.3 kg)     Fetal Status: Fetal Heart Rate (bpm): 138 Fundal Height: 32 cm Movement: Present     General:  Alert, oriented and cooperative. Patient is in no acute distress.  Skin: Skin is warm and dry. No rash noted.   Cardiovascular: Normal  heart rate noted  Respiratory: Normal respiratory effort, no problems with respiration noted  Abdomen: Soft, gravid, appropriate for gestational age.  Pain/Pressure: Present     Pelvic: Cervical exam deferred        Extremities: Normal range of motion.  Edema: Trace  Mental Status:  Normal mood and affect. Normal behavior. Normal judgment and thought content.   Assessment and Plan:  Pregnancy: [id number] at [redacted]w[redacted]d  1. Supervision of high risk pregnancy, antepartum   2. [redacted] weeks gestation of pregnancy   3. History of pre-eclampsia BP normal on recheck, PIH precautions given, will follow closely  4. History of oligohydramnios in prior pregnancy, currently pregnant, third trimester   5. HSV-2 infection complicating pregnancy, third trimester No report of lesions, start routine prophylaxis at 36 weeks  6. Late prenatal care affecting pregnancy in third trimester   7. History of preterm delivery Cerclage in place, MFM scan on 11/2  8. Depressed mood Pt denies any ideation to hurt self or others, virtual session has been scheduled - Ambulatory referral to [facility]  9. Thrombocytopenia ([place]) Recheck platelets at next visit  10. Rh negative status during pregnancy in third trimester   79. Cervical cerclage suture present in third trimester Will likely need removal at 36-37 weeks, continue to search for op note.  Preterm labor symptoms and general obstetric precautions including but not limited to vaginal bleeding, contractions, leaking of fluid and fetal movement were reviewed in detail with the patient.  Please refer to After Visit Summary for other counseling recommendations.   Return in about 2 weeks (around 11/02/2020) for [facility], in person.   Lynnda Shields, MD

## 2020-10-19 NOTE — Progress Notes (Signed)
ROB   CC: pt states she is dealing with depression.still grieving from the passing of her children's  father this past March. She is currently raising her 3 children alone.  Also notes HA today   PHQ-9 Screening Done today = 9

## 2020-10-23 ENCOUNTER — Ambulatory Visit (INDEPENDENT_AMBULATORY_CARE_PROVIDER_SITE_OTHER): Payer: Medicaid Other | Admitting: Licensed Clinical Social Worker

## 2020-10-23 ENCOUNTER — Other Ambulatory Visit: Payer: Self-pay

## 2020-10-23 DIAGNOSIS — Z3A00 Weeks of gestation of pregnancy not specified: Secondary | ICD-10-CM | POA: Diagnosis not present

## 2020-10-23 DIAGNOSIS — F32A Depression, unspecified: Secondary | ICD-10-CM

## 2020-10-23 DIAGNOSIS — O99340 Other mental disorders complicating pregnancy, unspecified trimester: Secondary | ICD-10-CM

## 2020-10-23 MED ORDER — ESOMEPRAZOLE MAGNESIUM 40 MG PO CPDR: 40.0000 mg | DELAYED_RELEASE_CAPSULE | Freq: Every day | ORAL | 2 refills | Status: DC

## 2020-10-23 NOTE — Telephone Encounter (Signed)
Refill on Nexium

## 2020-10-23 NOTE — Telephone Encounter (Signed)
Refill on nexium 

## 2020-10-24 ENCOUNTER — Ambulatory Visit: Payer: Medicaid Other | Attending: Obstetrics

## 2020-10-24 ENCOUNTER — Ambulatory Visit: Payer: Medicaid Other

## 2020-10-25 NOTE — BH Specialist Note (Signed)
Integrated Behavioral Health via Telemedicine Video (Caregility) Visit  10/25/2020 Patricia Valencia [id number]  Number of [place] visits: 1 Session Start time: 9:02am  Session End time: 10:05am Total time: 63 minutes via mychart   Referring Provider: L. Elgie Congo MD Type of Service: Individual Patient/Family location: Home  [facility] Provider location: [place] All persons participating in visit: Pt B Pompey and LCSWA A. Linton Rump    I connected with [place] Nicolson and/or [place] Kannan's n/a by a video enabled telemedicine application Publishing rights manager) and verified that I am speaking with the correct person using two identifiers.   Discussed confidentiality: yes  Confirmed demographics & insurance:  no  I discussed that engaging in this virtual visit, they consent to the provision of behavioral healthcare and the services will be billed under their insurance.   Patient and/or legal guardian expressed understanding and consented to virtual visit: yes  PRESENTING CONCERNS: Patient and/or family reports the following symptoms/concerns: depressed mood, isolation, feeling of guilt, stress, family issues  Duration of problem: March 2021; Severity of problem: Moderate  STRENGTHS (Protective Factors/Coping Skills): Pt does not present with any coping skills.  ASSESSMENT: Patient currently experiencing depression affecting pregnancy. Ms. Monrreal reports FOB (current pregnancy) was murder March 2021, during this time she did not know she was pregnant. Ms. Bloom reports 34 year old is exhibiting behavior challenges such as running away. Ms. Bufkin reports she does not have a good support system and relocated to [facility] to create distance with herself and family members.    GOALS ADDRESSED: Patient will: 1.  Reduce symptoms of:  Depression  2.  Increase knowledge and/or ability of: learn coping skills to alleviate reported and new symptoms   3.  Demonstrate ability to: self  manage symptoms    Progress of Goals: n/a  INTERVENTIONS: Interventions utilized:  Supportive counseling  Standardized Assessments completed & reviewed:    Routine Prenatal from 10/19/2020 in [place]  PHQ-9 Total Score 9       OUTCOME: Patient Response: n/a   PLAN: 1. Follow up with behavioral health clinician on : 2 weeks via mychart  2. Behavioral recommendations: Start a journal to identify causes of emotions, take prenatal vitamins as directed, prioritize rest  3. Referral(s): n/a  I discussed the assessment and treatment plan with the patient and/or parent/guardian. They were provided an opportunity to ask questions and all were answered. They agreed with the plan and demonstrated an understanding of the instructions.   They were advised to call back or seek an in-person evaluation as appropriate.  I discussed that the purpose of this visit is to provide behavioral health care while limiting exposure to the novel coronavirus.  Discussed there is a possibility of technology failure and discussed alternative modes of communication if that failure occurs.  Lynnea Ferrier

## 2020-10-31 ENCOUNTER — Ambulatory Visit: Payer: Medicaid Other | Admitting: *Deleted

## 2020-10-31 ENCOUNTER — Other Ambulatory Visit: Payer: Self-pay

## 2020-10-31 ENCOUNTER — Ambulatory Visit: Payer: Medicaid Other | Attending: Obstetrics

## 2020-10-31 ENCOUNTER — Other Ambulatory Visit: Payer: Self-pay | Admitting: Obstetrics

## 2020-10-31 DIAGNOSIS — O0990 Supervision of high risk pregnancy, unspecified, unspecified trimester: Secondary | ICD-10-CM | POA: Insufficient documentation

## 2020-10-31 DIAGNOSIS — O163 Unspecified maternal hypertension, third trimester: Secondary | ICD-10-CM

## 2020-10-31 DIAGNOSIS — B009 Herpesviral infection, unspecified: Secondary | ICD-10-CM

## 2020-10-31 DIAGNOSIS — O09293 Supervision of pregnancy with other poor reproductive or obstetric history, third trimester: Secondary | ICD-10-CM

## 2020-10-31 DIAGNOSIS — O3433 Maternal care for cervical incompetence, third trimester: Secondary | ICD-10-CM | POA: Diagnosis not present

## 2020-10-31 DIAGNOSIS — O365930 Maternal care for other known or suspected poor fetal growth, third trimester, not applicable or unspecified: Secondary | ICD-10-CM | POA: Insufficient documentation

## 2020-10-31 DIAGNOSIS — Z3A34 34 weeks gestation of pregnancy: Secondary | ICD-10-CM

## 2020-10-31 DIAGNOSIS — O360193 Maternal care for anti-D [Rh] antibodies, unspecified trimester, fetus 3: Secondary | ICD-10-CM | POA: Diagnosis not present

## 2020-10-31 DIAGNOSIS — D696 Thrombocytopenia, unspecified: Secondary | ICD-10-CM

## 2020-10-31 DIAGNOSIS — Z363 Encounter for antenatal screening for malformations: Secondary | ICD-10-CM

## 2020-10-31 DIAGNOSIS — O0933 Supervision of pregnancy with insufficient antenatal care, third trimester: Secondary | ICD-10-CM

## 2020-10-31 DIAGNOSIS — O3430 Maternal care for cervical incompetence, unspecified trimester: Secondary | ICD-10-CM

## 2020-10-31 DIAGNOSIS — O3413 Maternal care for benign tumor of corpus uteri, third trimester: Secondary | ICD-10-CM | POA: Diagnosis not present

## 2020-10-31 DIAGNOSIS — O98513 Other viral diseases complicating pregnancy, third trimester: Secondary | ICD-10-CM | POA: Insufficient documentation

## 2020-10-31 DIAGNOSIS — O99113 Other diseases of the blood and blood-forming organs and certain disorders involving the immune mechanism complicating pregnancy, third trimester: Secondary | ICD-10-CM

## 2020-10-31 DIAGNOSIS — D259 Leiomyoma of uterus, unspecified: Secondary | ICD-10-CM

## 2020-10-31 DIAGNOSIS — O4100X0 Oligohydramnios, unspecified trimester, not applicable or unspecified: Secondary | ICD-10-CM

## 2020-10-31 DIAGNOSIS — O09213 Supervision of pregnancy with history of pre-term labor, third trimester: Secondary | ICD-10-CM

## 2020-10-31 NOTE — Procedures (Signed)
Patricia Valencia [birth year] [redacted]w[redacted]d  Fetus A Non-Stress Test Interpretation for 10/31/20  Indication: IUGR and Unsatisfactory BPP  Fetal Heart Rate A Mode: External Baseline Rate (A): 130 bpm Variability: Moderate Accelerations: 15 x 15 Decelerations: None Multiple birth?: No  Uterine Activity Mode: Palpation, Toco Contraction Frequency (min): Occas Contraction Duration (sec): 20-45 Contraction Quality: Mild Resting Tone Palpated: Relaxed Resting Time: Adequate  Interpretation (Fetal Testing) Nonstress Test Interpretation: Reactive Comments: Dr. Donalee Citrin reviewed tracing.

## 2020-11-01 ENCOUNTER — Other Ambulatory Visit: Payer: Self-pay | Admitting: *Deleted

## 2020-11-02 ENCOUNTER — Ambulatory Visit (INDEPENDENT_AMBULATORY_CARE_PROVIDER_SITE_OTHER): Payer: Medicaid Other | Admitting: Obstetrics & Gynecology

## 2020-11-02 ENCOUNTER — Other Ambulatory Visit: Payer: Self-pay

## 2020-11-02 VITALS — BP 133/89 | HR 80 | Wt 167.9 lb

## 2020-11-02 DIAGNOSIS — G5600 Carpal tunnel syndrome, unspecified upper limb: Secondary | ICD-10-CM

## 2020-11-02 DIAGNOSIS — O26899 Other specified pregnancy related conditions, unspecified trimester: Secondary | ICD-10-CM

## 2020-11-02 DIAGNOSIS — O0990 Supervision of high risk pregnancy, unspecified, unspecified trimester: Secondary | ICD-10-CM

## 2020-11-02 DIAGNOSIS — O365930 Maternal care for other known or suspected poor fetal growth, third trimester, not applicable or unspecified: Secondary | ICD-10-CM

## 2020-11-02 DIAGNOSIS — Z8751 Personal history of pre-term labor: Secondary | ICD-10-CM

## 2020-11-02 DIAGNOSIS — O3433 Maternal care for cervical incompetence, third trimester: Secondary | ICD-10-CM

## 2020-11-02 NOTE — Patient Instructions (Signed)
Carpal Tunnel Syndrome  Carpal tunnel syndrome is a condition that causes pain in your hand and arm. The carpal tunnel is a narrow area that is on the palm side of your wrist. Repeated wrist motion or certain diseases may cause swelling in the tunnel. This swelling can pinch the main nerve in the wrist (median nerve). What are the causes? This condition may be caused by:  Repeated wrist motions.  Wrist injuries.  Arthritis.  A sac of fluid (cyst) or abnormal growth (tumor) in the carpal tunnel.  Fluid buildup during pregnancy. Sometimes the cause is not known. What increases the risk? The following factors may make you more likely to develop this condition:  Having a job in which you move your wrist in the same way many times. This includes jobs like being a butcher or a cashier.  Being a woman.  Having other health conditions, such as: ? Diabetes. ? Obesity. ? A thyroid gland that is not active enough (hypothyroidism). ? Kidney failure. What are the signs or symptoms? Symptoms of this condition include:  A tingling feeling in your fingers.  Tingling or a loss of feeling (numbness) in your hand.  Pain in your entire arm. This pain may get worse when you bend your wrist and elbow for a long time.  Pain in your wrist that goes up your arm to your shoulder.  Pain that goes down into your palm or fingers.  A weak feeling in your hands. You may find it hard to grab and hold items. You may feel worse at night. How is this diagnosed? This condition is diagnosed with a medical history and physical exam. You may also have tests, such as:  Electromyogram (EMG). This test checks the signals that the nerves send to the muscles.  Nerve conduction study. This test checks how well signals pass through your nerves.  Imaging tests, such as X-rays, ultrasound, and MRI. These tests check for what might be the cause of your condition. How is this treated? This condition may be treated  with:  Lifestyle changes. You will be asked to stop or change the activity that caused your problem.  Doing exercise and activities that make bones and muscles stronger (physical therapy).  Learning how to use your hand again (occupational therapy).  Medicines for pain and swelling (inflammation). You may have injections in your wrist.  A wrist splint.  Surgery. Follow these instructions at home: If you have a splint:  Wear the splint as told by your doctor. Remove it only as told by your doctor.  Loosen the splint if your fingers: ? Tingle. ? Lose feeling (become numb). ? Turn cold and blue.  Keep the splint clean.  If the splint is not waterproof: ? Do not let it get wet. ? Cover it with a watertight covering when you take a bath or a shower. Managing pain, stiffness, and swelling   If told, put ice on the painful area: ? If you have a removable splint, remove it as told by your doctor. ? Put ice in a plastic bag. ? Place a towel between your skin and the bag. ? Leave the ice on for 20 minutes, 2-3 times per day. General instructions  Take over-the-counter and prescription medicines only as told by your doctor.  Rest your wrist from any activity that may cause pain. If needed, talk with your boss at work about changes that can help your wrist heal.  Do any exercises as told by your doctor,   physical therapist, or occupational therapist.  Keep all follow-up visits as told by your doctor. This is important. Contact a doctor if:  You have new symptoms.  Medicine does not help your pain.  Your symptoms get worse. Get help right away if:  You have very bad numbness or tingling in your wrist or hand. Summary  Carpal tunnel syndrome is a condition that causes pain in your hand and arm.  It is often caused by repeated wrist motions.  Lifestyle changes and medicines are used to treat this problem. Surgery may help in very bad cases.  Follow your doctor's  instructions about wearing a splint, resting your wrist, keeping follow-up visits, and calling for help. This information is not intended to replace advice given to you by your health care provider. Make sure you discuss any questions you have with your health care provider. Document Revised: 04/17/2018 Document Reviewed: 04/17/2018 Elsevier Patient Education  2020 Elsevier Inc.  

## 2020-11-02 NOTE — Progress Notes (Signed)
   PRENATAL VISIT NOTE  Subjective:  Patricia Valencia is a 34 y.o. [phone] at [redacted]w[redacted]d being seen today for ongoing prenatal care.  She is currently monitored for the following issues for this high-risk pregnancy and has Female stress incontinence; History of pre-eclampsia; Pregnant; Thrombocytopenia ([place]); Rh negative status during pregnancy; History of oligohydramnios in prior pregnancy, currently pregnant, third trimester; HSV-2 infection complicating pregnancy, third trimester; Cervical cerclage suture present; Supervision of high risk pregnancy, antepartum; [redacted] weeks gestation of pregnancy; Late prenatal care affecting pregnancy in third trimester; History of preterm delivery; and Depressed mood on their problem list.  Patient reports bilateral hand pain.  Contractions: Not present. Vag. Bleeding: None.  Movement: Present. Denies leaking of fluid.   The following portions of the patient's history were reviewed and updated as appropriate: allergies, current medications, past family history, past medical history, past social history, past surgical history and problem list.   Objective:   Vitals:   11/02/20 1346  BP: 133/89  Pulse: 80  Weight: 167 lb 14.4 oz (76.2 kg)    Fetal Status: Fetal Heart Rate (bpm): 141   Movement: Present     General:  Alert, oriented and cooperative. Patient is in no acute distress.  Skin: Skin is warm and dry. No rash noted.   Cardiovascular: Normal heart rate noted  Respiratory: Normal respiratory effort, no problems with respiration noted  Abdomen: Soft, gravid, appropriate for gestational age.  Pain/Pressure: Present     Pelvic: Cervical exam deferred        Extremities: Normal range of motion.  Edema: Trace  Mental Status: Normal mood and affect. Normal behavior. Normal judgment and thought content.  Hand pain in pattern c/w CTS Assessment and Plan:  Pregnancy: [id number] at [redacted]w[redacted]d 1. Supervision of high risk pregnancy, antepartum CTS - Wrist brace cock up  volar  2. History of preterm delivery Remove cerclage after 36 weeks  3. Cervical cerclage suture present in third trimester  - Wrist brace cock up volar  4. Carpal tunnel syndrome during pregnancy IUGR f/u in MFM  Preterm labor symptoms and general obstetric precautions including but not limited to vaginal bleeding, contractions, leaking of fluid and fetal movement were reviewed in detail with the patient. Please refer to After Visit Summary for other counseling recommendations.   Return in about 1 week (around 11/09/2020).  Future Appointments  Date Time Provider [place]  11/06/2020 10:30 AM Lynnea Ferrier, LCSW [facility] None  11/07/2020 10:15 AM [facility]SE [facility]  11/07/2020 10:30 AM [facility]  11/13/2020  3:15 PM [facility]URSE [facility]  11/13/2020  3:30 PM [facility] US3 [facility]  11/21/2020  3:00 PM [facility]URSE [facility]  11/21/2020  3:15 PM [facility] US2 [facility]  11/28/2020  3:30 PM [facility] NURSE [facility]  11/28/2020  3:45 PM [facility] US4 [facility] [place]    Emeterio Reeve, MD

## 2020-11-02 NOTE — Progress Notes (Signed)
Pt states hands are "aching"

## 2020-11-06 ENCOUNTER — Ambulatory Visit: Payer: Medicaid Other | Admitting: Licensed Clinical Social Worker

## 2020-11-07 ENCOUNTER — Encounter: Payer: Self-pay | Admitting: *Deleted

## 2020-11-07 ENCOUNTER — Ambulatory Visit: Payer: Medicaid Other | Admitting: *Deleted

## 2020-11-07 ENCOUNTER — Encounter (HOSPITAL_COMMUNITY): Payer: Self-pay | Admitting: Obstetrics and Gynecology

## 2020-11-07 ENCOUNTER — Encounter: Payer: Self-pay | Admitting: Obstetrics and Gynecology

## 2020-11-07 ENCOUNTER — Other Ambulatory Visit: Payer: Self-pay

## 2020-11-07 ENCOUNTER — Ambulatory Visit (HOSPITAL_BASED_OUTPATIENT_CLINIC_OR_DEPARTMENT_OTHER): Payer: Medicaid Other

## 2020-11-07 ENCOUNTER — Inpatient Hospital Stay (HOSPITAL_COMMUNITY)
Admission: AD | Admit: 2020-11-07 | Discharge: 2020-11-10 | DRG: 768 | Disposition: A | Discharge status: Home / Self Care | Payer: Medicaid Other | Attending: Obstetrics and Gynecology | Admitting: Obstetrics and Gynecology

## 2020-11-07 ENCOUNTER — Ambulatory Visit (INDEPENDENT_AMBULATORY_CARE_PROVIDER_SITE_OTHER): Payer: Medicaid Other | Admitting: Obstetrics and Gynecology

## 2020-11-07 VITALS — BP 167/94 | HR 74 | Wt 173.0 lb

## 2020-11-07 DIAGNOSIS — O09293 Supervision of pregnancy with other poor reproductive or obstetric history, third trimester: Secondary | ICD-10-CM

## 2020-11-07 DIAGNOSIS — Z3A35 35 weeks gestation of pregnancy: Secondary | ICD-10-CM

## 2020-11-07 DIAGNOSIS — O3413 Maternal care for benign tumor of corpus uteri, third trimester: Secondary | ICD-10-CM

## 2020-11-07 DIAGNOSIS — O26893 Other specified pregnancy related conditions, third trimester: Secondary | ICD-10-CM | POA: Diagnosis present

## 2020-11-07 DIAGNOSIS — Z3490 Encounter for supervision of normal pregnancy, unspecified, unspecified trimester: Secondary | ICD-10-CM | POA: Diagnosis present

## 2020-11-07 DIAGNOSIS — O0990 Supervision of high risk pregnancy, unspecified, unspecified trimester: Secondary | ICD-10-CM

## 2020-11-07 DIAGNOSIS — Z8759 Personal history of other complications of pregnancy, childbirth and the puerperium: Secondary | ICD-10-CM

## 2020-11-07 DIAGNOSIS — A6000 Herpesviral infection of urogenital system, unspecified: Secondary | ICD-10-CM | POA: Diagnosis present

## 2020-11-07 DIAGNOSIS — O26873 Cervical shortening, third trimester: Secondary | ICD-10-CM | POA: Diagnosis present

## 2020-11-07 DIAGNOSIS — O360130 Maternal care for anti-D [Rh] antibodies, third trimester, not applicable or unspecified: Secondary | ICD-10-CM

## 2020-11-07 DIAGNOSIS — D6959 Other secondary thrombocytopenia: Secondary | ICD-10-CM | POA: Diagnosis present

## 2020-11-07 DIAGNOSIS — Z20822 Contact with and (suspected) exposure to covid-19: Secondary | ICD-10-CM | POA: Diagnosis present

## 2020-11-07 DIAGNOSIS — O1414 Severe pre-eclampsia complicating childbirth: Principal | ICD-10-CM | POA: Diagnosis present

## 2020-11-07 DIAGNOSIS — D696 Thrombocytopenia, unspecified: Secondary | ICD-10-CM

## 2020-11-07 DIAGNOSIS — O1410 Severe pre-eclampsia, unspecified trimester: Secondary | ICD-10-CM

## 2020-11-07 DIAGNOSIS — O3433 Maternal care for cervical incompetence, third trimester: Secondary | ICD-10-CM | POA: Diagnosis not present

## 2020-11-07 DIAGNOSIS — Z8751 Personal history of pre-term labor: Secondary | ICD-10-CM

## 2020-11-07 DIAGNOSIS — F419 Anxiety disorder, unspecified: Secondary | ICD-10-CM | POA: Diagnosis not present

## 2020-11-07 DIAGNOSIS — O99119 Other diseases of the blood and blood-forming organs and certain disorders involving the immune mechanism complicating pregnancy, unspecified trimester: Secondary | ICD-10-CM

## 2020-11-07 DIAGNOSIS — O0933 Supervision of pregnancy with insufficient antenatal care, third trimester: Secondary | ICD-10-CM

## 2020-11-07 DIAGNOSIS — O98513 Other viral diseases complicating pregnancy, third trimester: Secondary | ICD-10-CM

## 2020-11-07 DIAGNOSIS — Z6791 Unspecified blood type, Rh negative: Secondary | ICD-10-CM

## 2020-11-07 DIAGNOSIS — O1413 Severe pre-eclampsia, third trimester: Secondary | ICD-10-CM

## 2020-11-07 DIAGNOSIS — B009 Herpesviral infection, unspecified: Secondary | ICD-10-CM | POA: Insufficient documentation

## 2020-11-07 DIAGNOSIS — O9832 Other infections with a predominantly sexual mode of transmission complicating childbirth: Secondary | ICD-10-CM | POA: Diagnosis present

## 2020-11-07 DIAGNOSIS — O09213 Supervision of pregnancy with history of pre-term labor, third trimester: Secondary | ICD-10-CM

## 2020-11-07 DIAGNOSIS — O4103X0 Oligohydramnios, third trimester, not applicable or unspecified: Secondary | ICD-10-CM

## 2020-11-07 DIAGNOSIS — O9912 Other diseases of the blood and blood-forming organs and certain disorders involving the immune mechanism complicating childbirth: Secondary | ICD-10-CM | POA: Diagnosis present

## 2020-11-07 DIAGNOSIS — R4589 Other symptoms and signs involving emotional state: Secondary | ICD-10-CM | POA: Diagnosis present

## 2020-11-07 DIAGNOSIS — O26899 Other specified pregnancy related conditions, unspecified trimester: Secondary | ICD-10-CM

## 2020-11-07 DIAGNOSIS — O139 Gestational [pregnancy-induced] hypertension without significant proteinuria, unspecified trimester: Secondary | ICD-10-CM

## 2020-11-07 DIAGNOSIS — D259 Leiomyoma of uterus, unspecified: Secondary | ICD-10-CM

## 2020-11-07 DIAGNOSIS — O365930 Maternal care for other known or suspected poor fetal growth, third trimester, not applicable or unspecified: Secondary | ICD-10-CM

## 2020-11-07 DIAGNOSIS — O163 Unspecified maternal hypertension, third trimester: Secondary | ICD-10-CM

## 2020-11-07 DIAGNOSIS — O99345 Other mental disorders complicating the puerperium: Secondary | ICD-10-CM | POA: Diagnosis not present

## 2020-11-07 DIAGNOSIS — O99113 Other diseases of the blood and blood-forming organs and certain disorders involving the immune mechanism complicating pregnancy, third trimester: Secondary | ICD-10-CM

## 2020-11-07 DIAGNOSIS — R0602 Shortness of breath: Secondary | ICD-10-CM | POA: Diagnosis present

## 2020-11-07 LAB — CBC
HCT: 38.9 % (ref 36.0–46.0)
Hemoglobin: 12.5 g/dL (ref 12.0–15.0)
MCH: 27.1 pg (ref 26.0–34.0)
MCHC: 32.1 g/dL (ref 30.0–36.0)
MCV: 84.4 fL (ref 80.0–100.0)
Platelets: 69 10*3/uL — ABNORMAL LOW (ref 150–400)
RBC: 4.61 MIL/uL (ref 3.87–5.11)
RDW: 13.8 % (ref 11.5–15.5)
WBC: 9 10*3/uL (ref 4.0–10.5)
nRBC: 0 % (ref 0.0–0.2)

## 2020-11-07 LAB — COMPREHENSIVE METABOLIC PANEL
ALT: 12 U/L (ref 0–44)
AST: 17 U/L (ref 15–41)
Albumin: 3.1 g/dL — ABNORMAL LOW (ref 3.5–5.0)
Alkaline Phosphatase: 160 U/L — ABNORMAL HIGH (ref 38–126)
Anion gap: 9 (ref 5–15)
BUN: 5 mg/dL — ABNORMAL LOW (ref 6–20)
CO2: 21 mmol/L — ABNORMAL LOW (ref 22–32)
Calcium: 9.5 mg/dL (ref 8.9–10.3)
Chloride: 105 mmol/L (ref 98–111)
Creatinine, Ser: 0.65 mg/dL (ref 0.44–1.00)
GFR, Estimated: >60 mL/min (ref >60)
Glucose, Bld: 92 mg/dL (ref 70–99)
Potassium: 3.7 mmol/L (ref 3.5–5.1)
Sodium: 135 mmol/L (ref 135–145)
Total Bilirubin: 0.3 mg/dL (ref 0.3–1.2)
Total Protein: 6.7 g/dL (ref 6.5–8.1)

## 2020-11-07 LAB — PROTEIN / CREATININE RATIO, URINE
Creatinine, Urine: 101.54 mg/dL
Protein Creatinine Ratio: 0.12 mg/mg{Cre} (ref 0.00–0.15)
Total Protein, Urine: 12 mg/dL

## 2020-11-07 LAB — GROUP B STREP BY PCR: Group B strep by PCR: NEGATIVE

## 2020-11-07 LAB — TYPE AND SCREEN
ABO/RH(D): A NEG
Antibody Screen: NEGATIVE

## 2020-11-07 LAB — RESPIRATORY PANEL BY RT PCR (FLU A&B, COVID)
Influenza A by PCR: NEGATIVE
Influenza B by PCR: NEGATIVE
SARS Coronavirus 2 by RT PCR: NEGATIVE

## 2020-11-07 MED ORDER — ACETAMINOPHEN 325 MG PO TABS
650.0000 mg | ORAL_TABLET | ORAL | Status: DC | PRN
  Administered 2020-11-07: 650 mg via ORAL
  Filled 2020-11-07 (×2): qty 2

## 2020-11-07 MED ORDER — MISOPROSTOL 50MCG HALF TABLET
50.0000 ug | ORAL_TABLET | ORAL | Status: DC
  Administered 2020-11-07: 50 ug via BUCCAL

## 2020-11-07 MED ORDER — LABETALOL HCL 5 MG/ML IV SOLN: 80.0000 mg | INTRAVENOUS | Status: DC | PRN

## 2020-11-07 MED ORDER — OXYCODONE-ACETAMINOPHEN 5-325 MG PO TABS
2.0000 | ORAL_TABLET | ORAL | Status: DC | PRN
  Administered 2020-11-08: 2 via ORAL
  Filled 2020-11-07: qty 2

## 2020-11-07 MED ORDER — LACTATED RINGERS IV SOLN: 500.0000 mL | INTRAVENOUS | Status: DC | PRN

## 2020-11-07 MED ORDER — TERBUTALINE SULFATE 1 MG/ML IJ SOLN: 0.2500 mg | Freq: Once | INTRAMUSCULAR | Status: DC | PRN

## 2020-11-07 MED ORDER — OXYTOCIN BOLUS FROM INFUSION
333.0000 mL | Freq: Once | INTRAVENOUS | Status: AC
  Administered 2020-11-08: 333 mL via INTRAVENOUS

## 2020-11-07 MED ORDER — HYDRALAZINE HCL 20 MG/ML IJ SOLN: 10.0000 mg | INTRAMUSCULAR | Status: DC | PRN

## 2020-11-07 MED ORDER — BETAMETHASONE SOD PHOS & ACET 6 (3-3) MG/ML IJ SUSP
12.0000 mg | INTRAMUSCULAR | Status: DC
  Administered 2020-11-07: 12 mg via INTRAMUSCULAR
  Filled 2020-11-07: qty 5

## 2020-11-07 MED ORDER — LABETALOL HCL 5 MG/ML IV SOLN: 20.0000 mg | INTRAVENOUS | Status: DC | PRN

## 2020-11-07 MED ORDER — MISOPROSTOL 50MCG HALF TABLET
50.0000 ug | ORAL_TABLET | ORAL | Status: DC
  Filled 2020-11-07: qty 1

## 2020-11-07 MED ORDER — MAGNESIUM SULFATE BOLUS VIA INFUSION
4.0000 g | Freq: Once | INTRAVENOUS | Status: AC
  Administered 2020-11-07: 4 g via INTRAVENOUS
  Filled 2020-11-07: qty 1000

## 2020-11-07 MED ORDER — ONDANSETRON HCL 4 MG/2ML IJ SOLN: 4.0000 mg | Freq: Four times a day (QID) | INTRAMUSCULAR | Status: DC | PRN

## 2020-11-07 MED ORDER — OXYTOCIN-SODIUM CHLORIDE 30-0.9 UT/500ML-% IV SOLN
1.0000 m[IU]/min | INTRAVENOUS | Status: DC
  Administered 2020-11-07: 2 m[IU]/min via INTRAVENOUS

## 2020-11-07 MED ORDER — LIDOCAINE HCL (PF) 1 % IJ SOLN: 30.0000 mL | INTRAMUSCULAR | Status: DC | PRN

## 2020-11-07 MED ORDER — LACTATED RINGERS IV SOLN: INTRAVENOUS | Status: DC

## 2020-11-07 MED ORDER — SODIUM CHLORIDE 0.9 % IV SOLN
5.0000 10*6.[IU] | Freq: Once | INTRAVENOUS | Status: AC
  Administered 2020-11-07: 5 10*6.[IU] via INTRAVENOUS
  Filled 2020-11-07: qty 5

## 2020-11-07 MED ORDER — SOD CITRATE-CITRIC ACID 500-334 MG/5ML PO SOLN: 30.0000 mL | ORAL | Status: DC | PRN

## 2020-11-07 MED ORDER — OXYCODONE-ACETAMINOPHEN 5-325 MG PO TABS
1.0000 | ORAL_TABLET | ORAL | Status: DC | PRN
  Administered 2020-11-10: 1 via ORAL
  Filled 2020-11-07: qty 1

## 2020-11-07 MED ORDER — LABETALOL HCL 5 MG/ML IV SOLN: 40.0000 mg | INTRAVENOUS | Status: DC | PRN

## 2020-11-07 MED ORDER — PENICILLIN G POT IN DEXTROSE 60000 UNIT/ML IV SOLN
3.0000 10*6.[IU] | INTRAVENOUS | Status: DC
  Administered 2020-11-07 – 2020-11-08 (×3): 3 10*6.[IU] via INTRAVENOUS
  Filled 2020-11-07 (×3): qty 50

## 2020-11-07 MED ORDER — MAGNESIUM SULFATE 40 GM/1000ML IV SOLN
2.0000 g/h | INTRAVENOUS | Status: DC
  Administered 2020-11-07: 2 g/h via INTRAVENOUS
  Filled 2020-11-07: qty 1000

## 2020-11-07 MED ORDER — OXYTOCIN-SODIUM CHLORIDE 30-0.9 UT/500ML-% IV SOLN
2.5000 [IU]/h | INTRAVENOUS | Status: DC
  Filled 2020-11-07: qty 500

## 2020-11-07 MED ORDER — FENTANYL CITRATE (PF) 100 MCG/2ML IJ SOLN
50.0000 ug | INTRAMUSCULAR | Status: DC | PRN
  Administered 2020-11-07: 100 ug via INTRAVENOUS
  Administered 2020-11-08: 50 ug via INTRAVENOUS
  Administered 2020-11-08: 100 ug via INTRAVENOUS
  Filled 2020-11-07 (×3): qty 2

## 2020-11-07 NOTE — Progress Notes (Signed)
Labor Progress Note Patricia Valencia is a 34 y.o. [phone] at [redacted]w[redacted]d presented for preeclampsia with severe features based on headache, visual scotomas.  S: Pt reports doing well after getting some dinner. Headache improved. No concerns at this time.  O:  BP (!) 141/88   Pulse 80   Temp 98.2 F (36.8 C) (Oral)   Resp 16   Ht 5\' 3"  (1.6 m)   Wt 78 kg   LMP 02/18/2020 (Approximate)   BMI 30.47 kg/m  EFM: baseline 120/moderate variability/+accels/no decels Toco: ctx q3-62min  CVE: Dilation: 5 Effacement (%): 50 Station: -2 Presentation: Vertex Exam by:: Dr. Astrid Drafts   A&P: 34 y.o. [id number] [redacted]w[redacted]d presenting for Preeclampsia with severe features. #Preterm Induction: S/p BMZ x1 @1745  today. NICU aware. Progressing well. S/p FB placement and cytotec at 1748. FB out at 2210. Plan to start pitocin and up-titrate as clinically indicated. #Pain: IV fentanyl prn (pt unable to receive epidural given thrombocytopenia <100,000 #FWB: Category 1 strip #GBS unknown: continued on PCN since admission given preterm. GBS culture swab collected. #Preeclampsia with SF (HA): UPC 0.12 with unremarkable CMP and CBC except for baseline thrombocytopenia. Several severe range BPs in clinic and 1 additional severe range since admission without need for IV antihypertensives. Most recently in mild range. HA improved. Continued on Magnesium since admission. Will continue to monitor. #Thrombocytopenia: Plts 69 on admission. Pt aware of inability to receive epidural. #FGR: EFW 8% at [redacted]w[redacted]d. BPP 8/8 today in clinic with elevated dopplers but no evidence of absent/reversed end diastolic flow. #H/o Preterm Delivery  Short Cervix: cerclage removed today. #HSV2: no recent outbreaks. On suppression with good adherence. No visible lesions on admission. #Rh negative: s/p rhogam in prenatal period. #H/o Depression: FOB died 04/12/20. Previously on lexapro but not recently taking. Plan for SW postpartum with 1 week mood check in  clinic.  Randa Ngo, MD 10:24 PM

## 2020-11-07 NOTE — Progress Notes (Signed)
Dr Sylvester Harder and Dr Marcie Bal notified of platelet count.  Pt also made aware she will be unable to get an epidural for labor pain control.

## 2020-11-07 NOTE — H&P (Signed)
OBSTETRIC ADMISSION HISTORY AND PHYSICAL  Patricia Valencia is a 34 y.o. female [phone] with IUP at [redacted]w[redacted]d by LMP presenting as a transfer from clinic for new diagnosis of preE with SF (headache, BP). She reports +FMs, No LOF, no VB, no blurry vision, or peripheral edema, and RUQ pain.  She plans on bottle feeding. She is undecided for birth control. She received her prenatal care at [place]: By LMP --->  Estimated Date of Delivery: 12/10/20  [place]ono:    10/31/20@[redacted]w[redacted]d , CWD, normal anatomy, cephalic presentation, 3329J, 8% EFW   Prenatal History/Complications:  preE w/ SF (headache, BP--previously gHTN) Rh neg Thrombocytopenia (last platelets 88 on 10/11/20) History of depression, FOB died 03/21/20 (not on meds) HSV 2 (on suppression, last outbreak over 1 year ago) Cerclage in place (short cervix, history PTD) FGR (8%ile) Late PNC (transfer from [employer] in [place], [place])   Past Medical History: Past Medical History:  Diagnosis Date  . Female stress incontinence 06/14/2020  . Hypertension    Gestational  . Vaginal Pap smear, abnormal     Past Surgical History: Past Surgical History:  Procedure Laterality Date  . CERVICAL CERCLAGE      Obstetrical History: OB History    Gravida  5   Para  3   Term  1   Preterm  2   AB  1   Living  3     SAB  0   TAB  1   Ectopic  0   Multiple  0   Live Births  3           Social History Social History   Socioeconomic History  . Marital status: Single    Spouse name: Not on file  . Number of children: Not on file  . Years of education: Not on file  . Highest education level: Not on file  Occupational History  . Not on file  Tobacco Use  . Smoking status: Never Smoker  . Smokeless tobacco: Never Used  Vaping Use  . Vaping Use: Never used  Substance and Sexual Activity  . Alcohol use: Never  . Drug use: Never  . Sexual activity: Yes  Other Topics Concern  . Not on file  Social History Narrative   . Not on file   Social Determinants of Health   Financial Resource Strain:   . Difficulty of Paying Living Expenses: Not on file  Food Insecurity:   . Worried About Charity fundraiser in the Last Year: Not on file  . Ran Out of Food in the Last Year: Not on file  Transportation Needs:   . Lack of Transportation (Medical): Not on file  . Lack of Transportation (Non-Medical): Not on file  Physical Activity:   . Days of Exercise per Week: Not on file  . Minutes of Exercise per Session: Not on file  Stress:   . Feeling of Stress : Not on file  Social Connections:   . Frequency of Communication with Friends and Family: Not on file  . Frequency of Social Gatherings with Friends and Family: Not on file  . Attends Religious Services: Not on file  . Active Member of Clubs or Organizations: Not on file  . Attends Archivist Meetings: Not on file  . Marital Status: Not on file    Family History: Family History  Problem Relation Age of Onset  . Hypertension Mother   . Hypertension Father     Allergies: Allergies  Allergen Reactions  . Sulfa Antibiotics Swelling    Facial swelling    Medications Prior to Admission  Medication Sig Dispense Refill Last Dose  . aspirin 81 MG EC tablet Adult Low Dose Aspirin 81 mg tablet,delayed release  Take 1 tablet every day by oral route. (Patient not taking: Reported on 10/11/2020)     . butalbital-acetaminophen-caffeine (FIORICET) 50-325-40 MG tablet Take 1-2 tablets by mouth every 6 (six) hours as needed for headache. 30 tablet 2   . Elastic Bandages & Supports (COMFORT FIT MATERNITY SUPP SM) MISC Wear as directed. (Patient not taking: Reported on 11/07/2020) 1 each 0   . escitalopram (LEXAPRO) 5 MG tablet Take 5 mg by mouth daily. (Patient not taking: Reported on 10/11/2020)     . esomeprazole (NEXIUM) 40 MG capsule Take 40 mg by mouth daily. (Patient not taking: Reported on 11/07/2020)     . esomeprazole (NEXIUM) 40 MG capsule  Take 1 capsule (40 mg total) by mouth daily at 12 noon. 30 capsule 2   . Prenatal Vit-Fe Fumarate-FA (PRENATAL MULTIVITAMIN) TABS tablet Take 1 tablet by mouth daily at 12 noon.     . valACYclovir (VALTREX) 1000 MG tablet Take 1 tablet (1,000 mg total) by mouth daily. For suppression in pregnancy. 30 tablet 5      Review of Systems   All systems reviewed and negative except as stated in HPI  Last menstrual period 02/18/2020. General appearance: alert, cooperative and no distress Lungs: normal respiratory effort Heart: regular rate and rhythm Abdomen: soft, non-tender; gravid Pelvic: as noted below Extremities: Homans sign is negative, no sign of DVT Presentation: cephalic by cervical exam Fetal monitoringBaseline: 130 bpm, Variability: Good {> 6 bpm), Accelerations: Reactive and Decelerations: Absent Uterine activityFrequency: intermittent     Prenatal labs: ABO, Rh: --/--/A NEG (07/27 1556) Antibody: NEG (07/27 1556) Rubella:   RPR: Non Reactive (10/20 1057)  HBsAg:    HIV: Non Reactive (10/20 1057)  GBS:   unknown 2 hr Glucola passed Genetic screening not done Anatomy US normal except FGR (8%ile)  Prenatal Transfer Tool  Maternal Diabetes: No Genetic Screening: not done Maternal Ultrasounds/Referrals: IUGR Fetal Ultrasounds or other Referrals:  Referred to Materal Fetal Medicine  Maternal Substance Abuse:  No Significant Maternal Medications:  Meds include: Nexium Other: valtrex Significant Maternal Lab Results: Rh negative, GBS unk  No results found for this or any previous visit (from the past 24 hour(s)).  Patient Active Problem List   Diagnosis Date Noted  . Encounter for induction of labor 11/07/2020  . Supervision of high risk pregnancy, antepartum 10/19/2020  . Late prenatal care affecting pregnancy in third trimester 10/19/2020  . History of preterm delivery 10/19/2020  . Depressed mood 10/19/2020  . Rh negative status during pregnancy 10/11/2020  .  History of oligohydramnios in prior pregnancy, currently pregnant, third trimester 10/11/2020  . 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 Cervical cerclage suture present 10/11/2020  . Thrombocytopenia ([place]) 09/29/2020  . History of pre-eclampsia 05/29/2020    Assessment/Plan:  Patricia Sippel is a 34 y.o. [phone] at [redacted]w[redacted]d here for IOL-preE w/SF  #IOL: Patient presents from clinic for [place] w/ SF. Previously gHTN, hx of preE in prior pregnancy. Patient has severe range BP in clinic and headaches. Will initiate induction with cytotec. FB placed manually after discussion of risks/benefits. Cerclage x2 removed with the assistance of Dr. Nehemiah Settle.  #preE w/ SF: severe range blood pressure, headache currently. PreE labs pending. Will start Mg and IV  anti-hypertension protocol. Continue to monitor.  #Rh neg: Received rhogam previously. Rhogam workup postpartum.  #Gestational thrombocytopenia: last platelets 88 on 10/11/20, repeat cbc pending.  #HSV-2: last outbreak over 1 year ago, on supression. SSE without lesions.  #Cerclage in place: removed with cervical check.  #History of depression: FOB died March 23, 2020. Extremely anxious upon presentation. Previously prescribed lexapro, has not taken. Will consult SW postpartum.  #Pain: PRN #FWB: Cat 1 #ID: GBS unknown, PCR pending, PCN ordered given GA #MOF: bottle #MOC: undecided #Circ: n/a  Arrie Senate, MD  11/07/2020, 3:56 PM

## 2020-11-07 NOTE — Progress Notes (Signed)
   PRENATAL VISIT NOTE  Subjective:  Patricia Valencia is a 34 y.o. [phone] at [redacted]w[redacted]d being seen today for ongoing prenatal care.  She is currently monitored for the following issues for this high-risk pregnancy and has Female stress incontinence; History of pre-eclampsia; Pregnant; Thrombocytopenia ([place]); Rh negative status during pregnancy; History of oligohydramnios in prior pregnancy, currently pregnant, third trimester; HSV-2 infection complicating pregnancy, third trimester; Cervical cerclage suture present; Supervision of high risk pregnancy, antepartum; [redacted] weeks gestation of pregnancy; Late prenatal care affecting pregnancy in third trimester; History of preterm delivery; and Depressed mood on their problem list.  Patient reports Patient c/o headaches.  She states that she was seeing spots last night.  She denies any CP, SOB, or RUQ abdominal pain.  The patient reports good fetal movement.  .  Contractions: Not present. Vag. Bleeding: None.  Movement: Present. Denies leaking of fluid.   The following portions of the patient's history were reviewed and updated as appropriate: allergies, current medications, past family history, past medical history, past social history, past surgical history and problem list.   Objective:   Vitals:   11/07/20 1401 11/07/20 1407  BP: (!) 165/95 (!) 167/94  Pulse: 62 74  Weight: 173 lb (78.5 kg)     Fetal Status:     Movement: Present     General:  Alert, oriented and cooperative. Patient is in no acute distress.  Skin: Skin is warm and dry. No rash noted.   Cardiovascular: Normal heart rate noted  Respiratory: Normal respiratory effort, no problems with respiration noted  Abdomen: Soft, gravid, appropriate for gestational age.  Pain/Pressure: Present     Pelvic: Cervical exam deferred        Extremities: Normal range of motion.  Edema: Trace  DTRs - 3+ on leftt, no clonus, 2+ on right  Mental Status: Normal mood and affect. Normal behavior. Normal  judgment and thought content.   Assessment and Plan:  Pregnancy: [id number] at [redacted]w[redacted]d 1. Supervision of high risk pregnancy, antepartum   2. Gestational hypertension, antepartum - Now with severe features. - To labor and delivery now.  Patient is not to drive to L&D.    3. History of pre-eclampsia   4. Thrombocytopenia affecting pregnancy, antepartum ([place]) - To labor and delivery now for further workup.  Likely HELLP syndrome.    5. Cervical cerclage suture present in third trimester - Due to be removed at 36 weeks.  Will remove if need for induction is noted.    6. Poor fetal growth affecting management of mother in third trimester, single or unspecified fetus - Ultrasound today shows growth at 8th%tile with a BPP of 8/10.  Preterm labor symptoms and general obstetric precautions including but not limited to vaginal bleeding, contractions, leaking of fluid and fetal movement were reviewed in detail with the patient. Please refer to After Visit Summary for other counseling recommendations.   No follow-ups on file.  Future Appointments  Date Time Provider [place]  11/13/2020  3:15 PM [facility] NURSE [facility]  11/13/2020  3:30 PM [facility]3 [facility]  11/21/2020  3:00 PM [facility] NURSE [facility]  11/21/2020  3:15 PM [facility] US2 [facility]  11/28/2020  3:30 PM [facility] NURSE [facility]  11/28/2020  3:45 PM [facility] US4 [facility] [place]    Cephas Darby, MD

## 2020-11-08 ENCOUNTER — Encounter (HOSPITAL_COMMUNITY): Payer: Self-pay | Admitting: Family Medicine

## 2020-11-08 DIAGNOSIS — O1410 Severe pre-eclampsia, unspecified trimester: Secondary | ICD-10-CM

## 2020-11-08 DIAGNOSIS — O1414 Severe pre-eclampsia complicating childbirth: Secondary | ICD-10-CM

## 2020-11-08 DIAGNOSIS — Z3A35 35 weeks gestation of pregnancy: Secondary | ICD-10-CM

## 2020-11-08 LAB — RPR: RPR Ser Ql: NONREACTIVE

## 2020-11-08 LAB — COMPREHENSIVE METABOLIC PANEL
ALT: 13 U/L (ref 0–44)
AST: 25 U/L (ref 15–41)
Albumin: 2.7 g/dL — ABNORMAL LOW (ref 3.5–5.0)
Alkaline Phosphatase: 140 U/L — ABNORMAL HIGH (ref 38–126)
Anion gap: 11 (ref 5–15)
BUN: <5 mg/dL — ABNORMAL LOW (ref 6–20)
CO2: 17 mmol/L — ABNORMAL LOW (ref 22–32)
Calcium: 7.8 mg/dL — ABNORMAL LOW (ref 8.9–10.3)
Chloride: 106 mmol/L (ref 98–111)
Creatinine, Ser: 0.92 mg/dL (ref 0.44–1.00)
GFR, Estimated: >60 mL/min (ref >60)
Glucose, Bld: 179 mg/dL — ABNORMAL HIGH (ref 70–99)
Potassium: 3.8 mmol/L (ref 3.5–5.1)
Sodium: 134 mmol/L — ABNORMAL LOW (ref 135–145)
Total Bilirubin: 0.4 mg/dL (ref 0.3–1.2)
Total Protein: 5.9 g/dL — ABNORMAL LOW (ref 6.5–8.1)

## 2020-11-08 LAB — CBC
HCT: 37.5 % (ref 36.0–46.0)
Hemoglobin: 12.1 g/dL (ref 12.0–15.0)
MCH: 27.2 pg (ref 26.0–34.0)
MCHC: 32.3 g/dL (ref 30.0–36.0)
MCV: 84.3 fL (ref 80.0–100.0)
Platelets: 72 10*3/uL — ABNORMAL LOW (ref 150–400)
RBC: 4.45 MIL/uL (ref 3.87–5.11)
RDW: 13.5 % (ref 11.5–15.5)
WBC: 19.9 10*3/uL — ABNORMAL HIGH (ref 4.0–10.5)
nRBC: 0 % (ref 0.0–0.2)

## 2020-11-08 MED ORDER — COCONUT OIL OIL: 1.0000 "application " | TOPICAL_OIL | Status: DC | PRN

## 2020-11-08 MED ORDER — ONDANSETRON HCL 4 MG/2ML IJ SOLN: 4.0000 mg | INTRAMUSCULAR | Status: DC | PRN

## 2020-11-08 MED ORDER — SODIUM CHLORIDE 0.9% FLUSH: 9.0000 mL | INTRAVENOUS | Status: DC | PRN

## 2020-11-08 MED ORDER — DIBUCAINE (PERIANAL) 1 % EX OINT: 1.0000 "application " | TOPICAL_OINTMENT | CUTANEOUS | Status: DC | PRN

## 2020-11-08 MED ORDER — ONDANSETRON HCL 4 MG/2ML IJ SOLN: 4.0000 mg | Freq: Four times a day (QID) | INTRAMUSCULAR | Status: DC | PRN

## 2020-11-08 MED ORDER — BENZOCAINE-MENTHOL 20-0.5 % EX AERO
1.0000 "application " | INHALATION_SPRAY | CUTANEOUS | Status: DC | PRN
  Administered 2020-11-08: 1 via TOPICAL
  Filled 2020-11-08: qty 56

## 2020-11-08 MED ORDER — DIPHENHYDRAMINE HCL 12.5 MG/5ML PO ELIX
12.5000 mg | ORAL_SOLUTION | Freq: Four times a day (QID) | ORAL | Status: DC | PRN
  Filled 2020-11-08: qty 5

## 2020-11-08 MED ORDER — TETANUS-DIPHTH-ACELL PERTUSSIS 5-2.5-18.5 LF-MCG/0.5 IM SUSY: 0.5000 mL | PREFILLED_SYRINGE | Freq: Once | INTRAMUSCULAR | Status: DC

## 2020-11-08 MED ORDER — DIPHENHYDRAMINE HCL 25 MG PO CAPS: 25.0000 mg | ORAL_CAPSULE | Freq: Four times a day (QID) | ORAL | Status: DC | PRN

## 2020-11-08 MED ORDER — SENNOSIDES-DOCUSATE SODIUM 8.6-50 MG PO TABS
2.0000 | ORAL_TABLET | ORAL | Status: DC
  Administered 2020-11-09 (×2): 2 via ORAL
  Filled 2020-11-08 (×2): qty 2

## 2020-11-08 MED ORDER — AMLODIPINE BESYLATE 5 MG PO TABS
5.0000 mg | ORAL_TABLET | Freq: Every day | ORAL | Status: DC
  Administered 2020-11-08 – 2020-11-10 (×3): 5 mg via ORAL
  Filled 2020-11-08 (×3): qty 1

## 2020-11-08 MED ORDER — SIMETHICONE 80 MG PO CHEW
80.0000 mg | CHEWABLE_TABLET | ORAL | Status: DC | PRN
  Administered 2020-11-10: 80 mg via ORAL
  Filled 2020-11-08: qty 1

## 2020-11-08 MED ORDER — NALOXONE HCL 0.4 MG/ML IJ SOLN: 0.4000 mg | INTRAMUSCULAR | Status: DC | PRN

## 2020-11-08 MED ORDER — WITCH HAZEL-GLYCERIN EX PADS: 1.0000 "application " | MEDICATED_PAD | CUTANEOUS | Status: DC | PRN

## 2020-11-08 MED ORDER — PRENATAL MULTIVITAMIN CH
1.0000 | ORAL_TABLET | Freq: Every day | ORAL | Status: DC
  Administered 2020-11-08 – 2020-11-10 (×3): 1 via ORAL
  Filled 2020-11-08 (×3): qty 1

## 2020-11-08 MED ORDER — ONDANSETRON HCL 4 MG PO TABS: 4.0000 mg | ORAL_TABLET | ORAL | Status: DC | PRN

## 2020-11-08 MED ORDER — ACETAMINOPHEN 325 MG PO TABS
650.0000 mg | ORAL_TABLET | Freq: Four times a day (QID) | ORAL | Status: DC
  Administered 2020-11-08 – 2020-11-10 (×8): 650 mg via ORAL
  Filled 2020-11-08 (×7): qty 2

## 2020-11-08 MED ORDER — MAGNESIUM SULFATE 40 GM/1000ML IV SOLN
2.0000 g/h | INTRAVENOUS | Status: AC
  Administered 2020-11-08: 2 g/h via INTRAVENOUS
  Filled 2020-11-08: qty 1000

## 2020-11-08 MED ORDER — DIPHENHYDRAMINE HCL 50 MG/ML IJ SOLN: 12.5000 mg | Freq: Four times a day (QID) | INTRAMUSCULAR | Status: DC | PRN

## 2020-11-08 MED ORDER — HYDROMORPHONE 1 MG/ML IV SOLN: INTRAVENOUS | Status: DC

## 2020-11-08 NOTE — Discharge Summary (Addendum)
Postpartum Discharge Summary    Patient Name: Patricia Valencia DOB: 19-Apr-1986 MRN: [id number]  Date of admission: 11/07/2020 Delivery date:11/08/2020  Delivering provider: Randa Ngo  Date of discharge: 11/10/2020  Admitting diagnosis: Pregnancy at [place] Level. Severe pre-eclampsia (HA and BP). Cerclage in place  Secondary diagnosis: Rh negative, thrombocytopenia, h/o HSV2, h/o depression    Discharge diagnosis: Vaginal delivery                                         Post partum procedures:rhogam Augmentation: AROM, Pitocin, Cytotec and IP Foley Complications: None  Hospital course: Induction of Labor With Vaginal Delivery   34 y.o. yo [id number] at 16w3dwas admitted to the hospital 11/07/2020 for induction of labor.  Indication for induction: severe preeclampsia by headache, visual scotomas.  Patient had cerclage removed on admission and she had an uncomplicated labor course as follows: Membrane Rupture Time/Date: 3:35 AM ,11/08/2020   Delivery Method:Vaginal, Spontaneous  Episiotomy: None  Lacerations:  None  Details of delivery can be found in separate delivery note.  Patient had a routine postpartum course. She was started on norvasc prior to discharge. Based on her platelets at her early 1st trimester, I told her that she probably has ITP (99k @ [redacted]wks gestational age) but will need follow up at her PP visit and PRN subsequent hematology follow up, which I d/w her, as well.   Newborn Data: Birth date:11/08/2020  Birth time:6:04 AM  Gender:Female  Living status:Living  Apgars:8 ,9  Weight:2155 g   Magnesium Sulfate received: Yes: Seizure prophylaxis BMZ received: Yes (1 dose prior to delivery) Rhophylac:Yes  MMR: n/a Transfusion:No  Physical exam  Vitals:   11/10/20 0027 11/10/20 0211 11/10/20 0542 11/10/20 0803  BP: 122/71 133/69 117/77 139/85  Pulse: 75 78 78 70  Resp: _0 Temp: 98.9 F (37.2 C) 98.4 F (36.9 C) 97.9 F (36.6 C) 98 F (36.7 C)   TempSrc: Oral Oral Oral Oral  SpO2: 100%  99% 99%  Weight:      Height:       General: alert Lochia: appropriate Uterine Fundus: firm, nttp DVT Evaluation: No evidence of DVT seen on physical exam. Labs: CBC Latest Ref Rng & Units 11/09/2020 11/08/2020 11/07/2020  WBC 4.0 - 10.5 K/uL 13.4(H) 19.9(H) 9.0  Hemoglobin 12.0 - 15.0 g/dL 10.7(L) 12.1 12.5  Hematocrit 36 - 46 % 32.9(L) 37.5 38.9  Platelets 150 - 400 K/uL 82(L) 72(L) 69(L)    CMP Latest Ref Rng & Units 11/09/2020  Glucose 70 - 99 mg/dL 109(H)  BUN 6 - 20 mg/dL 6  Creatinine 0.44 - 1.00 mg/dL 0.67  Sodium 135 - 145 mmol/L 136  Potassium 3.5 - 5.1 mmol/L 3.7  Chloride 98 - 111 mmol/L 105  CO2 22 - 32 mmol/L 22  Calcium 8.9 - 10.3 mg/dL 7.5(L)  Total Protein 6.5 - 8.1 g/dL 5.6(L)  Total Bilirubin 0.3 - 1.2 mg/dL 0.3  Alkaline Phos 38 - 126 U/L 113  AST 15 - 41 U/L 18  ALT 0 - 44 U/L 11   Edinburgh Score: No flowsheet data found.   After visit meds:  Allergies as of 11/10/2020      Reactions   Sulfa Antibiotics Swelling   Facial swelling      Medication List    STOP taking these medications   aspirin 81 MG EC tablet  butalbital-acetaminophen-caffeine 50-325-40 MG tablet Commonly known as: FIORICET   valACYclovir 1000 MG tablet Commonly known as: VALTREX     TAKE these medications   acetaminophen 325 MG tablet Commonly known as: TYLENOL Take 2 tablets (650 mg total) by mouth every 4 (four) hours as needed (for pain scale < 4ORtemperature>/=100.5 F).   amLODipine 5 MG tablet Commonly known as: NORVASC Take 1 tablet (5 mg total) by mouth daily. Start taking on: November 11, 2020   [place] as directed.   escitalopram 5 MG tablet Commonly known as: LEXAPRO Take 5 mg by mouth daily.   esomeprazole 40 MG capsule Commonly known as: NEXIUM Take 40 mg by mouth daily. What changed: Another medication with the same name was removed. Continue taking this  medication, and follow the directions you see here.   prenatal multivitamin Tabs tablet Take 1 tablet by mouth daily at 12 noon.        Discharge home in stable condition Infant Feeding: Bottle Infant Disposition:home with mother Discharge instruction: per After Visit Summary and Postpartum booklet. Activity: Advance as tolerated. Pelvic rest for 6 weeks.  Diet: routine diet Future Appointments: Future Appointments  Date Time Provider [place]  11/14/2020  1:00 PM Lynnea Ferrier, [place] [facility] None  11/15/2020  3:40 PM [place] None  12/06/2020  1:00 PM Woodroe Mode, MD [place] None   Follow up Visit:  [place]. Go in 4 day(s).   Specialty: Obstetrics and Gynecology Why: and on 11/24 Contact information: [address], [place] [phone]             Message sent to [facility] on 11/08/20  Please schedule this patient for a In person postpartum visit in 4 weeks with the following provider: Any provider. Additional Postpartum F/U:Postpartum Depression checkup 1 week and BP check 1 week  High risk pregnancy complicated by: Preeclampsia with severe features, thrombocytopenia, HSV2, cervical insufficiency, MDD (FOB died 2020-03-23) Delivery mode:  Vaginal, Spontaneous  Anticipated Birth Control:  Unsure   11/10/2020 Aletha Halim, MD

## 2020-11-08 NOTE — Plan of Care (Signed)
  Problem: Education: Goal: Knowledge of General Education information will improve Description Including pain rating scale, medication(s)/side effects and non-pharmacologic comfort measures Outcome: Progressing   

## 2020-11-08 NOTE — Progress Notes (Signed)
CSW acknowledges consult and will meet with MOB once magnesium is discontinued to complete assessment.   Abundio Miu, [place] Worker [facility] Cell#: [phone]

## 2020-11-08 NOTE — Progress Notes (Signed)
Labor Progress Note Patricia Valencia is a 34 y.o. [phone] at [redacted]w[redacted]d presented for preeclampsia with severe features based on headache, visual scotomas.  S: Pt reports increasing discomfort with contractions. No recurrence of HA or visual disturbances.  O:  BP 127/70   Pulse 82   Temp 97.9 F (36.6 C) (Oral)   Resp 16   Ht 5\' 3"  (1.6 m)   Wt 78 kg   LMP 02/18/2020 (Approximate)   BMI 30.47 kg/m  EFM: baseline 110/moderate variability/+accels/no decels Toco: ctx q2-68min  CVE: Dilation: 6 Effacement (%): 60 Station: -1 Presentation: Vertex Exam by:: Dr. Astrid Drafts   A&P: 34 y.o. [id number] [redacted]w[redacted]d presenting for Preeclampsia with severe features. #Preterm Induction: S/p BMZ x1 @1745  today. NICU aware. Progressing well. S/p FB placement and cytotec at 1748. FB out at 2210. Pitocin started at 03-16-27 and up-titrating with good effect. Plan for AROM at next cervical check after shared decision making with pt. #Pain: IV fentanyl prn (pt unable to receive epidural given thrombocytopenia <100,000) #FWB: Category 1 strip #GBS unknown: continued on PCN since admission given preterm. GBS culture swab collected.  #Preeclampsia with SF (HA): UPC 0.12 with unremarkable CMP and CBC except for baseline thrombocytopenia. HA improved. Continued on Magnesium since admission. Blood pressure up-trending with 1 severe range--plan to recheck in 15 min and treat with IV antihypertensive per protocol if persistent severe range BPs. #Thrombocytopenia: Plts 69 on admission. Pt aware of inability to receive epidural. #FGR: EFW 8% at [redacted]w[redacted]d. BPP 8/8 today in clinic with elevated dopplers but no evidence of absent/reversed end diastolic flow. #H/o Preterm Delivery  Short Cervix: cerclage removed today. #HSV2: no recent outbreaks. On suppression with good adherence. No visible lesions on admission. #Rh negative: s/p rhogam in prenatal period. #H/o Depression: FOB died 03-15-20. Previously on lexapro but not recently taking.  Plan for SW postpartum with 1 week mood check in clinic.  Randa Ngo, MD 2:15 AM

## 2020-11-08 NOTE — Progress Notes (Signed)
Labor Progress Note Patricia Valencia is a 34 y.o. [phone] at [redacted]w[redacted]d presented for preeclampsia with severe features based on headache, visual scotomas.  S: Pt reports increasing discomfort with contractions. No recurrence of HA or visual disturbances.  O:  BP 127/70   Pulse 82   Temp 97.9 F (36.6 C) (Oral)   Resp 16   Ht 5\' 3"  (1.6 m)   Wt 78 kg   LMP 02/18/2020 (Approximate)   BMI 30.47 kg/m  EFM: baseline 110/moderate variability/+accels/no decels Toco: ctx q2-16min  CVE: Dilation: 8 Effacement (%): 70 Station: 0 Presentation: Vertex Exam by:: Dr. Astrid Drafts   A&P: 34 y.o. [id number] [redacted]w[redacted]d presenting for Preeclampsia with severe features. #Preterm Induction: S/p BMZ x1 @1745  today. NICU aware. Progressing well. S/p FB placement and cytotec at 1748. FB out at 2210. Pitocin started at 03-24-27 and up-titrating with good effect. AROM for clear fluid at 0330. Plan to recheck cervical exam in 2 hours or sooner as clinically indicated. #Pain: IV fentanyl prn (pt unable to receive epidural given thrombocytopenia <100,000) #FWB: Category 1 strip #GBS unknown: continued on PCN since admission given preterm. GBS culture swab collected.  #Preeclampsia with SF (HA): UPC 0.12 with unremarkable CMP and CBC except for baseline thrombocytopenia. HA improved. Continued on Magnesium since admission. Blood pressure most recently in mild range. No need for IV antihypertensives yet. Will continue to monitor. #Thrombocytopenia: Plts 69 on admission. Pt aware of inability to receive epidural. #FGR: EFW 8% at [redacted]w[redacted]d. BPP 8/8 today in clinic with elevated dopplers but no evidence of absent/reversed end diastolic flow. #H/o Preterm Delivery  Short Cervix: cerclage removed today. #HSV2: no recent outbreaks. On suppression with good adherence. No visible lesions on admission. #Rh negative: s/p rhogam in prenatal period. #H/o Depression: FOB died 2020/03/23. Previously on lexapro but not recently taking. Plan for SW  postpartum with 1 week mood check in clinic.  Randa Ngo, MD 3:47 AM

## 2020-11-09 ENCOUNTER — Encounter (HOSPITAL_COMMUNITY): Payer: Self-pay | Admitting: Family Medicine

## 2020-11-09 ENCOUNTER — Encounter: Payer: Medicaid Other | Admitting: Obstetrics

## 2020-11-09 LAB — CBC
HCT: 32.9 % — ABNORMAL LOW (ref 36.0–46.0)
Hemoglobin: 10.7 g/dL — ABNORMAL LOW (ref 12.0–15.0)
MCH: 27.4 pg (ref 26.0–34.0)
MCHC: 32.5 g/dL (ref 30.0–36.0)
MCV: 84.1 fL (ref 80.0–100.0)
Platelets: 82 10*3/uL — ABNORMAL LOW (ref 150–400)
RBC: 3.91 MIL/uL (ref 3.87–5.11)
RDW: 13.9 % (ref 11.5–15.5)
WBC: 13.4 10*3/uL — ABNORMAL HIGH (ref 4.0–10.5)
nRBC: 0 % (ref 0.0–0.2)

## 2020-11-09 LAB — CULTURE, BETA STREP (GROUP B ONLY)

## 2020-11-09 LAB — COMPREHENSIVE METABOLIC PANEL
ALT: 11 U/L (ref 0–44)
AST: 18 U/L (ref 15–41)
Albumin: 2.7 g/dL — ABNORMAL LOW (ref 3.5–5.0)
Alkaline Phosphatase: 113 U/L (ref 38–126)
Anion gap: 9 (ref 5–15)
BUN: 6 mg/dL (ref 6–20)
CO2: 22 mmol/L (ref 22–32)
Calcium: 7.5 mg/dL — ABNORMAL LOW (ref 8.9–10.3)
Chloride: 105 mmol/L (ref 98–111)
Creatinine, Ser: 0.67 mg/dL (ref 0.44–1.00)
GFR, Estimated: >60 mL/min (ref >60)
Glucose, Bld: 109 mg/dL — ABNORMAL HIGH (ref 70–99)
Potassium: 3.7 mmol/L (ref 3.5–5.1)
Sodium: 136 mmol/L (ref 135–145)
Total Bilirubin: 0.3 mg/dL (ref 0.3–1.2)
Total Protein: 5.6 g/dL — ABNORMAL LOW (ref 6.5–8.1)

## 2020-11-09 MED ORDER — RHO D IMMUNE GLOBULIN 1500 UNIT/2ML IJ SOSY
300.0000 ug | PREFILLED_SYRINGE | Freq: Once | INTRAMUSCULAR | Status: AC
  Administered 2020-11-09: 300 ug via INTRAVENOUS
  Filled 2020-11-09: qty 2

## 2020-11-09 NOTE — Progress Notes (Signed)
Daily Postpartum Note  Admission Date: 11/07/2020 Current Date: 11/09/2020 9:53 AM  [place] is a 34 y.o. [id number] PPD#1/intact perineum (EBL 38mL) @ 35wks, admitted for severe pre-eclampsia (HA, BP).  Pregnancy complicated by: Patient Active Problem List   Diagnosis Date Noted  . Vaginal delivery 11/08/2020  . Severe preeclampsia 11/08/2020  . Encounter for induction of labor 11/07/2020  . Supervision of high risk pregnancy, antepartum 10/19/2020  . Late prenatal care affecting pregnancy in third trimester 10/19/2020  . History of preterm delivery 10/19/2020  . Depressed mood 10/19/2020  . Rh negative status during pregnancy 10/11/2020  . History of oligohydramnios in prior pregnancy, currently pregnant, third trimester 10/11/2020  . 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 Cervical cerclage suture present 10/11/2020  . Thrombocytopenia ([place]) 09/29/2020  . History of pre-eclampsia 05/29/2020    Overnight/24hr events:  Start on norvasc 5 qday yesterday Mg off this morning  Subjective:  No s/s of pre-eclampsia  Objective:    Current Vital Signs 24h Vital Sign Ranges  T 97.9 F (36.6 C) Temp  Avg: 98.4 F (36.9 C)  Min: 97.9 F (36.6 C)  Max: 98.7 F (37.1 C)  BP 130/85 BP  Min: 130/85  Max: 149/92  HR 80 Pulse  Avg: 85.3  Min: 73  Max: 105  RR 18 Resp  Avg: 17.7  Min: 16  Max: 20  SaO2 99 % Room Air SpO2  Avg: 99.6 %  Min: 99 %  Max: 100 %       24 Hour I/O Current Shift I/O  Time Ins Outs 11/17 0701 - 11/18 0700 In: 7017 [P.O.:1920; I.V.:2316] Out: 8250 [Urine:7350] No intake/output data recorded.   Patient Vitals for the past 24 hrs:  BP Temp Temp src Pulse Resp SpO2  11/09/20 0620 -- -- -- -- -- 99 %  11/09/20 0619 130/85 97.9 F (36.6 C) Oral 80 18 --  11/09/20 0500 133/68 98.6 F (37 C) Oral 78 18 99 %  11/09/20 0426 (!) 143/84 98.6 F (37 C) Oral 73 16 100 %  11/09/20 0400 -- -- -- -- 18 --  11/09/20 0200 -- -- --  -- 18 --  11/09/20 0100 -- -- -- -- 16 --  11/09/20 0002 130/83 98.5 F (36.9 C) Oral 94 16 100 %  11/08/20 2300 -- -- -- -- 20 --  11/08/20 2200 -- -- -- -- 18 --  11/08/20 2100 -- -- -- -- 18 --  11/08/20 1939 (!) 145/86 98.7 F (37.1 C) Axillary 87 18 100 %  11/08/20 1643 (!) 147/78 98.5 F (36.9 C) Oral 80 18 100 %  11/08/20 1234 (!) 149/92 98.1 F (36.7 C) Oral (!) 105 -- 99 %    Physical exam: General: Well nourished, well developed female in no acute distress. Abdomen: soft, nttp,  Cardiovascular: S1, S2 normal, no murmur, rub or gallop, regular rate and rhythm Respiratory: CTAB Extremities: no clubbing, cyanosis or edema Skin: Warm and dry.   Medications: Current Facility-Administered Medications  Medication Dose Route Frequency Provider Last Rate Last Admin  . acetaminophen (TYLENOL) tablet 650 mg  650 mg Oral Q4H PRN Arrie Senate, MD   793 mg at 11/07/20 1744  . acetaminophen (TYLENOL) tablet 650 mg  650 mg Oral Q6H Randa Ngo, MD   650 mg at 11/09/20 0422  . amLODipine (NORVASC) tablet 5 mg  5 mg Oral Daily Aletha Halim, MD   5 mg at 11/08/20 1740  . benzocaine-Menthol (DERMOPLAST)  20-0.5 % topical spray 1 application  1 application Topical PRN Randa Ngo, MD   1 application at 51/70/01 0931  . coconut oil  1 application Topical PRN Randa Ngo, MD      . witch hazel-glycerin (TUCKS) pad 1 application  1 application Topical PRN [place], Gildardo Cranker, MD       And  . dibucaine (NUPERCAINAL) 1 % rectal ointment 1 application  1 application Rectal PRN Randa Ngo, MD      . diphenhydrAMINE (BENADRYL) capsule 25 mg  25 mg Oral Q6H PRN Randa Ngo, MD      . fentaNYL (SUBLIMAZE) injection 50-100 mcg  50-100 mcg Intravenous Q1H PRN Arrie Senate, MD   50 mcg at 11/08/20 0339  . labetalol (NORMODYNE) injection 20 mg  20 mg Intravenous PRN Randa Ngo, MD       And  . labetalol (NORMODYNE) injection 40 mg  40 mg Intravenous PRN Randa Ngo, MD       And  . labetalol (NORMODYNE) injection 80 mg  80 mg Intravenous PRN Randa Ngo, MD       And  . hydrALAZINE (APRESOLINE) injection 10 mg  10 mg Intravenous PRN Randa Ngo, MD      . lactated ringers infusion 500-1,000 mL  500-1,000 mL Intravenous PRN Arrie Senate, MD      . lactated ringers infusion   Intravenous Continuous Arrie Senate, MD 749 mL/hr at 11/08/20 2005 New Bag at 11/08/20 2005  . lidocaine (PF) (XYLOCAINE) 1 % injection 30 mL  30 mL Subcutaneous PRN Arrie Senate, MD      . ondansetron [facility]) injection 4 mg  4 mg Intravenous Q6H PRN Arrie Senate, MD      . ondansetron [facility]) tablet 4 mg  4 mg Oral Q4H PRN Randa Ngo, MD       Or  . ondansetron (ZOFRAN) injection 4 mg  4 mg Intravenous Q4H PRN Randa Ngo, MD      . oxyCODONE-acetaminophen (PERCOCET/ROXICET) 5-325 MG per tablet 1 tablet  1 tablet Oral Q4H PRN Arrie Senate, MD      . oxyCODONE-acetaminophen (PERCOCET/ROXICET) 5-325 MG per tablet 2 tablet  2 tablet Oral Q4H PRN Arrie Senate, MD   2 tablet at 11/08/20 1017  . oxytocin (PITOCIN) IV infusion 30 units in NS 500 mL - Premix  2.5 Units/hr Intravenous Continuous Arrie Senate, MD   Stopped at 11/08/20 1030  . prenatal multivitamin tablet 1 tablet  1 tablet Oral Q1200 Randa Ngo, MD   1 tablet at 11/08/20 1224  . senna-docusate (Senokot-S) tablet 2 tablet  2 tablet Oral Q24H Randa Ngo, MD   2 tablet at 11/09/20 0018  . simethicone (MYLICON) chewable tablet 80 mg  80 mg Oral PRN Randa Ngo, MD      . sodium citrate-citric acid (ORACIT) solution 30 mL  30 mL Oral Q2H PRN Arrie Senate, MD      . Tdap (BOOSTRIX) injection 0.5 mL  0.5 mL Intramuscular Once Randa Ngo, MD        Labs:  Recent Labs  Lab 11/07/20 1630 11/08/20 0832 11/09/20 0543  WBC 9.0 19.9* 13.4*  HGB 12.5 12.1 10.7*  HCT 38.9 37.5 32.9*  PLT 69* 72* 82*    Recent Labs  Lab  11/07/20 1630 11/08/20 0832 11/09/20 0543  NA 135 134* 136  K 3.7  3.8 3.7  CL 105 106 105  CO2 21* 17* 22  BUN 5* <5* 6  CREATININE 0.65 0.92 0.67  CALCIUM 9.5 7.8* 7.5*  PROT 6.7 5.9* 5.6*  BILITOT 0.3 0.4 0.3  ALKPHOS 160* 140* 113  ALT 12 13 11   AST 17 25 18   GLUCOSE 92 179* 109*     Radiology:  No new imaging  Assessment & Plan:  Pt doing well *Pregnancy: routine care. Formula. Girl. BC options d/w her *Rh neg: baby is rh pos. Rhogam ordered *Heme: NOB plts 99k in April 2021. Pt unaware of having low plts in the past. They are coming up nicely. Will continue to follow *Severe pre-x: continue norvasc.  *PPx: scds, oob ad lib *FEN/GI: regular diet *Dispo: likely tomorrow. Pt has 11/24 bp check already.   Durene Romans MD Attending Center for [facility])

## 2020-11-09 NOTE — Progress Notes (Signed)
CSW received consult for recent death of FOB. CSW met with MOB to offer support and complete assessment.    When CSW arrived, MOB was holding infant and appeared to be bonding. Another lady was also present and after CSW asked her to leave the room (MOB gave CSW permission to ask MOB's guest to leave) MOB identified the lady as FOB's mother. MOB was polite and easy to engage.   CSW asked about the recent loss of FOB and MOB reported that FOB was murdered on March 18, 2020.  MOB shared feelings of hurt and sadness.  CSW validated and normalized MOB's thoughts and feelings and shared other emotions that MOB may experience during the postpartum period. Without prompting, MOB also communicated that she voluntary committed herself in April 2021 to a inpatient [place] facility. MOB reported, I knew I didn't want to kill myself I just wanted to escape reality."  CSW was understanding and offered counseling resources. Per MOB, she is receiving outpatient counseling at [facility] and is pleased with the services.   CSW provided education regarding the baby blues period vs. perinatal mood disorders, discussed treatment and gave resources for mental health follow up if concerns arise.  CSW recommends self-evaluation during the postpartum time period and encouraged MOB to contact a medical professional if symptoms are noted at any time. MOB presented with insight and awareness and did not demonstrate any acute MH symptoms. CSW assessed for safety and MOB denied SI and HI.   CSW provided review of Sudden Infant Death Syndrome (SIDS) precautions.   CSW identifies no further need for intervention and no barriers to discharge at this time.  Laurey Arrow, MSW, LCSW Clinical Social Work [phone]

## 2020-11-10 ENCOUNTER — Encounter (HOSPITAL_COMMUNITY): Payer: Self-pay | Admitting: Obstetrics & Gynecology

## 2020-11-10 ENCOUNTER — Inpatient Hospital Stay (HOSPITAL_COMMUNITY)
Admission: AD | Admit: 2020-11-10 | Discharge: 2020-11-10 | Disposition: A | Discharge status: Home / Self Care | Payer: Medicaid Other | Attending: Obstetrics & Gynecology | Admitting: Obstetrics & Gynecology

## 2020-11-10 ENCOUNTER — Other Ambulatory Visit (HOSPITAL_COMMUNITY): Payer: Self-pay | Admitting: Obstetrics and Gynecology

## 2020-11-10 ENCOUNTER — Other Ambulatory Visit: Payer: Self-pay

## 2020-11-10 DIAGNOSIS — O99345 Other mental disorders complicating the puerperium: Secondary | ICD-10-CM | POA: Diagnosis not present

## 2020-11-10 DIAGNOSIS — F419 Anxiety disorder, unspecified: Secondary | ICD-10-CM | POA: Insufficient documentation

## 2020-11-10 LAB — RH IG WORKUP (INCLUDES ABO/RH)
ABO/RH(D): A NEG
Antibody Screen: NEGATIVE
Fetal Screen: NEGATIVE
Gestational Age(Wks): 35
Unit division: 0

## 2020-11-10 MED ORDER — AMLODIPINE BESYLATE 5 MG PO TABS: 5.0000 mg | ORAL_TABLET | Freq: Every day | ORAL | 0 refills | Status: DC

## 2020-11-10 MED ORDER — ACETAMINOPHEN 325 MG PO TABS
650.0000 mg | ORAL_TABLET | ORAL | Status: DC | PRN
Start: 2020-11-10 — End: 2021-03-16

## 2020-11-10 MED ORDER — HYDROXYZINE HCL 25 MG PO TABS: 25.0000 mg | ORAL_TABLET | Freq: Three times a day (TID) | ORAL | 0 refills | Status: AC | PRN

## 2020-11-10 MED FILL — AMLODIPINE BESYLATE 5 MG TA: 5 | 42 days supply | Qty: 42 | Fill #0

## 2020-11-10 NOTE — Discharge Instructions (Signed)

## 2020-11-10 NOTE — MAU Note (Addendum)
Presents with c/o SOB and upper abdominal pain under bilateral breast.  Also states having pain @ umbilicus, states feels like something is moving around, "can't explain."  Denies visual disturbances & H/A.  States "I just don't feel well>' S/P NVD 11/08/2020 @ 35+3 for severe Pre Eclampsia.

## 2020-11-10 NOTE — Discharge Instructions (Signed)
Do not take any NSAIDs, such as motrin, ibuprofen, naproxen, aleve, as these may affect your platelets   Hypertension During Pregnancy Hypertension is also called high blood pressure. High blood pressure means that the force of your blood moving in your body is too strong. It can cause problems for you and your baby. Different types of high blood pressure can happen during pregnancy. The types are:  High blood pressure before you got pregnant. This is called chronic hypertension.  This can continue during your pregnancy. Your doctor will want to keep checking your blood pressure. You may need medicine to keep your blood pressure under control while you are pregnant. You will need follow-up visits after you have your baby.  High blood pressure that goes up during pregnancy when it was normal before. This is called gestational hypertension. It will usually get better after you have your baby, but your doctor will need to watch your blood pressure to make sure that it is getting better.  Very high blood pressure during pregnancy. This is called preeclampsia. Very high blood pressure is an emergency that needs to be checked and treated right away.  You may develop very high blood pressure after giving birth. This is called postpartum preeclampsia. This usually occurs within 48 hours after childbirth but may occur up to 6 weeks after giving birth. This is rare. How does this affect me? If you have high blood pressure during pregnancy, you have a higher chance of developing high blood pressure:  As you get older.  If you get pregnant again. In some cases, high blood pressure during pregnancy can cause:  Stroke.  Heart attack.  Damage to the kidneys, lungs, or liver.  Preeclampsia.  Jerky movements you cannot control (convulsions or seizures).  Problems with the placenta.   What can I do to lower my risk?   Keep a healthy weight.  Eat a healthy diet.  Follow what your doctor tells  you about treating any medical problems that you had before becoming pregnant. It is very important to go to all of your doctor visits. Your doctor will check your blood pressure and make sure that your pregnancy is progressing as it should. Treatment should start early if a problem is found.   Follow these instructions at home:  Take your blood pressure 1-2 times per day. Call the office if your blood pressure is 155 or higher for the top number or 105 or higher for the bottom number.    Eating and drinking   Drink enough fluid to keep your pee (urine) pale yellow.  Avoid caffeine. Lifestyle  Do not use any products that contain nicotine or tobacco, such as cigarettes, e-cigarettes, and chewing tobacco. If you need help quitting, ask your doctor.  Do not use alcohol or drugs.  Avoid stress.  Rest and get plenty of sleep.  Regular exercise can help. Ask your doctor what kinds of exercise are best for you. General instructions  Take over-the-counter and prescription medicines only as told by your doctor.  Keep all prenatal and follow-up visits as told by your doctor. This is important. Contact a doctor if:  You have symptoms that your doctor told you to watch for, such as: ? Headaches. ? Nausea. ? Vomiting. ? Belly (abdominal) pain. ? Dizziness. ? Light-headedness. Get help right away if:  You have: ? Very bad belly pain that does not get better with treatment. ? A very bad headache that does not get better. ? Vomiting that  does not get better. ? Sudden, fast weight gain. ? Sudden swelling in your hands, ankles, or face. ? Blood in your pee. ? Blurry vision. ? Double vision. ? Shortness of breath. ? Chest pain. ? Weakness on one side of your body. ? Trouble talking. Summary  High blood pressure is also called hypertension.  High blood pressure means that the force of your blood moving in your body is too strong.  High blood pressure can cause problems for  you and your baby.  Keep all follow-up visits as told by your doctor. This is important. This information is not intended to replace advice given to you by your health care provider. Make sure you discuss any questions you have with your health care provider. Document Released: 01/11/2011 Document Revised: 04/01/2019 Document Reviewed: 01/05/2019 Elsevier Patient Education  2020 [employer].  Postpartum Care After Vaginal Delivery This sheet gives you information about how to care for yourself from the time you deliver your baby to up to 6-12 weeks after delivery (postpartum period). Your health care provider may also give you more specific instructions. If you have problems or questions, contact your health care provider. Follow these instructions at home: Vaginal bleeding  It is normal to have vaginal bleeding (lochia) after delivery. Wear a sanitary pad for vaginal bleeding and discharge. ? During the first week after delivery, the amount and appearance of lochia is often similar to a menstrual period. ? Over the next few weeks, it will gradually decrease to a dry, yellow-brown discharge. ? For most women, lochia stops completely by 4-6 weeks after delivery. Vaginal bleeding can vary from woman to woman.  Change your sanitary pads frequently. Watch for any changes in your flow, such as: ? A sudden increase in volume. ? A change in color. ? Large blood clots.  If you pass a blood clot from your vagina, save it and call your health care provider to discuss. Do not flush blood clots down the toilet before talking with your health care provider.  Do not use tampons or douches until your health care provider says this is safe.  If you are not breastfeeding, your period should return 6-8 weeks after delivery. If you are feeding your child breast milk only (exclusive breastfeeding), your period may not return until you stop breastfeeding. Perineal care  Keep the area between the vagina and  the anus (perineum) clean and dry as told by your health care provider. Use medicated pads and pain-relieving sprays and creams as directed.  If you had a cut in the perineum (episiotomy) or a tear in the vagina, check the area for signs of infection until you are healed. Check for: ? More redness, swelling, or pain. ? Fluid or blood coming from the cut or tear. ? Warmth. ? Pus or a bad smell.  You may be given a squirt bottle to use instead of wiping to clean the perineum area after you go to the bathroom. As you start healing, you may use the squirt bottle before wiping yourself. Make sure to wipe gently.  To relieve pain caused by an episiotomy, a tear in the vagina, or swollen veins in the anus (hemorrhoids), try taking a warm sitz bath 2-3 times a day. A sitz bath is a warm water bath that is taken while you are sitting down. The water should only come up to your hips and should cover your buttocks. Breast care  Within the first few days after delivery, your breasts may feel  heavy, full, and uncomfortable (breast engorgement). Milk may also leak from your breasts. Your health care provider can suggest ways to help relieve the discomfort. Breast engorgement should go away within a few days.  If you are breastfeeding: ? Wear a bra that supports your breasts and fits you well. ? Keep your nipples clean and dry. Apply creams and ointments as told by your health care provider. ? You may need to use breast pads to absorb milk that leaks from your breasts. ? You may have uterine contractions every time you breastfeed for up to several weeks after delivery. Uterine contractions help your uterus return to its normal size. ? If you have any problems with breastfeeding, work with your health care provider or Science writer.  If you are not breastfeeding: ? Avoid touching your breasts a lot. Doing this can make your breasts produce more milk. ? Wear a good-fitting bra and use cold packs to  help with swelling. ? Do not squeeze out (express) milk. This causes you to make more milk. Intimacy and sexuality  Ask your health care provider when you can engage in sexual activity. This may depend on: ? Your risk of infection. ? How fast you are healing. ? Your comfort and desire to engage in sexual activity.  You are able to get pregnant after delivery, even if you have not had your period. If desired, talk with your health care provider about methods of birth control (contraception). Medicines  Take over-the-counter and prescription medicines only as told by your health care provider.  If you were prescribed an antibiotic medicine, take it as told by your health care provider. Do not stop taking the antibiotic even if you start to feel better. Activity  Gradually return to your normal activities as told by your health care provider. Ask your health care provider what activities are safe for you.  Rest as much as possible. Try to rest or take a nap while your baby is sleeping. Eating and drinking   Drink enough fluid to keep your urine pale yellow.  Eat high-fiber foods every day. These may help prevent or relieve constipation. High-fiber foods include: ? Whole grain cereals and breads. ? Brown rice. ? Beans. ? Fresh fruits and vegetables.  Do not try to lose weight quickly by cutting back on calories.  Take your prenatal vitamins until your postpartum checkup or until your health care provider tells you it is okay to stop. Lifestyle  Do not use any products that contain nicotine or tobacco, such as cigarettes and e-cigarettes. If you need help quitting, ask your health care provider.  Do not drink alcohol, especially if you are breastfeeding. General instructions  Keep all follow-up visits for you and your baby as told by your health care provider. Most women visit their health care provider for a postpartum checkup within the first 3-6 weeks after delivery. Contact a  health care provider if:  You feel unable to cope with the changes that your child brings to your life, and these feelings do not go away.  You feel unusually sad or worried.  Your breasts become red, painful, or hard.  You have a fever.  You have trouble holding urine or keeping urine from leaking.  You have little or no interest in activities you used to enjoy.  You have not breastfed at all and you have not had a menstrual period for 12 weeks after delivery.  You have stopped breastfeeding and you have not had a  menstrual period for 12 weeks after you stopped breastfeeding.  You have questions about caring for yourself or your baby.  You pass a blood clot from your vagina. Get help right away if:  You have chest pain.  You have difficulty breathing.  You have sudden, severe leg pain.  You have severe pain or cramping in your lower abdomen.  You bleed from your vagina so much that you fill more than one sanitary pad in one hour. Bleeding should not be heavier than your heaviest period.  You develop a severe headache.  You faint.  You have blurred vision or spots in your vision.  You have bad-smelling vaginal discharge.  You have thoughts about hurting yourself or your baby. If you ever feel like you may hurt yourself or others, or have thoughts about taking your own life, get help right away. You can go to the nearest emergency department or call:  Your local emergency services (911 in the [place]  A suicide crisis helpline, such as the [place] at (707)159-4162. This is open 24 hours a day. Summary  The period of time right after you deliver your newborn up to 6-12 weeks after delivery is called the postpartum period.  Gradually return to your normal activities as told by your health care provider.  Keep all follow-up visits for you and your baby as told by your health care provider. This information is not intended to replace  advice given to you by your health care provider. Make sure you discuss any questions you have with your health care provider. Document Revised: 12/12/2017 Document Reviewed: 09/22/2017 Elsevier Patient Education  2020 [employer].

## 2020-11-10 NOTE — MAU Provider Note (Signed)
Patient Patricia Valencia is a 34 y.o. [id number]  at 2 days post NSVD at 81 week after IOL for severe pre-e. She is here with complaints of abdominal pain and SOB that started when she was discharged from the hospital today. She also endorses some "all over pain" in her abdomen and her thighs and under her breasts.  History     CSN: [id number]  Arrival date and time: 11/10/20 1831   First Provider Initiated Contact with Patient 11/10/20 1914      Chief Complaint  Patient presents with  . Shortness of Breath  . Abdominal Pain   HPI  Upon entry to the patient's room, patient immediately began to cry and reported that she is under a significant amount of stress. The FOB was killed in March, 2021 , and her daughter was born at 29 weeks, currently in the NICU. She has no family support in [place] and has lost her job. She reports that she was in bed the whole time she was in the hospital and did not walk around until she got home this afternoon. At home, she was taking care of her other three children and found herself SOB. She felt sore in her abdomen and underher breasts bilaterally. She could not sleep because she was worried she might die in her sleep. She back in to be seen. She is tearful and asking if she can see her daughter in the NICU>  OB History    Gravida  5   Para  4   Term  1   Preterm  3   AB  1   Living  4     SAB  0   TAB  1   Ectopic  0   Multiple  0   Live Births  4           Past Medical History:  Diagnosis Date  . Female stress incontinence 06/14/2020  . Hypertension    Gestational  . Vaginal Pap smear, abnormal     Past Surgical History:  Procedure Laterality Date  . CERVICAL CERCLAGE      Family History  Problem Relation Age of Onset  . Hypertension Mother   . Hypertension Father     Social History   Tobacco Use  . Smoking status: Never Smoker  . Smokeless tobacco: Never Used  Vaping Use  . Vaping Use: Never used  Substance Use Topics   . Alcohol use: Never  . Drug use: Never    Allergies:  Allergies  Allergen Reactions  . Sulfa Antibiotics Swelling    Facial swelling    Medications Prior to Admission  Medication Sig Dispense Refill Last Dose  . Prenatal Vit-Fe Fumarate-FA (PRENATAL MULTIVITAMIN) TABS tablet Take 1 tablet by mouth daily at 12 noon.   11/10/2020 at Unknown time  . acetaminophen (TYLENOL) 325 MG tablet Take 2 tablets (650 mg total) by mouth every 4 (four) hours as needed (for pain scale < 4ORtemperature>/=100.5 F).     Derrill Memo ON 11/11/2020] amLODipine (NORVASC) 5 MG tablet Take 1 tablet (5 mg total) by mouth daily. 42 tablet 0   . Elastic Bandages & Supports (COMFORT FIT MATERNITY SUPP SM) MISC Wear as directed. (Patient not taking: Reported on 11/07/2020) 1 each 0   . escitalopram (LEXAPRO) 5 MG tablet Take 5 mg by mouth daily. (Patient not taking: Reported on 10/11/2020)     . esomeprazole (NEXIUM) 40 MG capsule Take 40 mg by mouth daily. (Patient not taking:  Reported on 11/07/2020)       Review of Systems  Constitutional: Negative.   HENT: Negative.   Respiratory: Positive for shortness of breath.   Gastrointestinal: Positive for abdominal pain.  Genitourinary: Positive for vaginal pain.  Neurological: Positive for weakness.  Psychiatric/Behavioral: The patient is nervous/anxious.    Physical Exam   Blood pressure 138/86, pulse (!) 113, temperature 99.2 F (37.3 C), temperature source Oral, resp. rate 20, SpO2 100 %, unknown if currently breastfeeding.  Physical Exam Constitutional:      Appearance: She is well-developed.  HENT:     Head: Normocephalic.  Cardiovascular:     Rate and Rhythm: Normal rate and regular rhythm.  Pulmonary:     Effort: No tachypnea or respiratory distress.     Breath sounds: No stridor. No decreased breath sounds, wheezing or rhonchi.  Chest:     Chest wall: No crepitus.  Abdominal:     Palpations: Abdomen is soft.     Tenderness: There is no  abdominal tenderness. There is no guarding or rebound.  Skin:    General: Skin is warm and dry.  Neurological:     Mental Status: She is alert.     MAU Course  Procedures  MDM Long conversation at the bedside about patient's current stress and feelings of anxiety. Patient ultimately declined EKG and lab work, after crying she says "she just needed to let it all out".  No concerning physical exam findings; lungs are clear, heart rate is normal, patient is in no respiratory distress. BP labile; however, patient very upset and anxious.    We discussed coping mechanisms and mindfullness breathing/  She would like some medicine for anxiety.   Her BP was elevated in MAU but patient is on medicine which she takes in the morning. She denies HA, blurry vision floating spots, RUQ pain.   Patient Vitals for the past 24 hrs:  BP Temp Temp src Pulse Resp SpO2  11/10/20 1930 (!) 148/92 -- -- (!) 117 15 99 %  11/10/20 1916 129/82 -- -- (!) 113 -- --  11/10/20 1906 138/86 -- -- (!) 113 -- --  11/10/20 1848 (!) 152/88 99.2 F (37.3 C) Oral (!) 115 20 100 %    Assessment and Plan   1. Anxiety    2. Patient would like to be discharged and will visit NICU after MAU. She drove herself here and declines any anti-anxiety medicine.   3. Patient will keep appt for BP check and LCSW visit next week; reviewed warning signs and when to come to MAU.   4. RX for Vistaril given, instructed patient to try half tab at night to aid with sleep.   5. Patient relieved after discussion that complaints are normal after NSVD and stress reactions; thanked provider for care.   6. All questions answered; patient stable for discharge.   Mervyn Skeeters Bhavin Monjaraz 11/10/2020, 7:24 PM

## 2020-11-13 ENCOUNTER — Ambulatory Visit: Payer: Medicaid Other

## 2020-11-14 ENCOUNTER — Encounter: Payer: Medicaid Other | Admitting: Obstetrics & Gynecology

## 2020-11-14 ENCOUNTER — Encounter: Payer: Medicaid Other | Admitting: Licensed Clinical Social Worker

## 2020-11-15 ENCOUNTER — Ambulatory Visit: Payer: Medicaid Other

## 2020-11-21 ENCOUNTER — Ambulatory Visit: Payer: Medicaid Other

## 2020-11-22 ENCOUNTER — Other Ambulatory Visit: Payer: Self-pay

## 2020-11-22 ENCOUNTER — Ambulatory Visit (INDEPENDENT_AMBULATORY_CARE_PROVIDER_SITE_OTHER): Payer: Medicaid Other | Admitting: Obstetrics

## 2020-11-22 ENCOUNTER — Encounter: Payer: Self-pay | Admitting: Obstetrics

## 2020-11-22 VITALS — BP 135/81 | HR 68 | Wt 153.0 lb

## 2020-11-22 DIAGNOSIS — R300 Dysuria: Secondary | ICD-10-CM | POA: Diagnosis not present

## 2020-11-22 DIAGNOSIS — D696 Thrombocytopenia, unspecified: Secondary | ICD-10-CM | POA: Diagnosis not present

## 2020-11-22 LAB — POCT URINALYSIS DIPSTICK
Bilirubin, UA: NEGATIVE
Glucose, UA: NEGATIVE
Ketones, UA: POSITIVE
Nitrite, UA: NEGATIVE
Protein, UA: POSITIVE — AB
Spec Grav, UA: >=1.03 — AB (ref 1.010–1.025)
Urobilinogen, UA: 0.2 E.U./dL
pH, UA: 5 (ref 5.0–8.0)

## 2020-11-22 MED ORDER — CEFUROXIME AXETIL 500 MG PO TABS: 500.0000 mg | ORAL_TABLET | Freq: Two times a day (BID) | ORAL | 0 refills | Status: DC

## 2020-11-22 NOTE — Progress Notes (Signed)
Patient ID: Patricia Valencia, female   DOB: 1986/03/27, 34 y.o.   MRN: [id number]  Chief Complaint  Patient presents with  . Dysuria    HPI Patricia Valencia is a 34 y.o. female.  Presents 2 weeks postpartum with urinary frequency and burning.  Pregnancy was complicated by gestational thrombocytopenia. HPI  Past Medical History:  Diagnosis Date  . Female stress incontinence 06/14/2020  . Hypertension    Gestational  . Vaginal Pap smear, abnormal     Past Surgical History:  Procedure Laterality Date  . CERVICAL CERCLAGE      Family History  Problem Relation Age of Onset  . Hypertension Mother   . Hypertension Father     Social History Social History   Tobacco Use  . Smoking status: Never Smoker  . Smokeless tobacco: Never Used  Vaping Use  . Vaping Use: Never used  Substance Use Topics  . Alcohol use: Never  . Drug use: Never    Allergies  Allergen Reactions  . Sulfa Antibiotics Swelling    Facial swelling    Current Outpatient Medications  Medication Sig Dispense Refill  . acetaminophen (TYLENOL) 325 MG tablet Take 2 tablets (650 mg total) by mouth every 4 (four) hours as needed (for pain scale < 4ORtemperature>/=100.5 F).    Marland Kitchen amLODipine (NORVASC) 5 MG tablet Take 1 tablet (5 mg total) by mouth daily. 42 tablet 0  . cefUROXime (CEFTIN) 500 MG tablet Take 1 tablet (500 mg total) by mouth 2 (two) times daily with a meal. 14 tablet 0  . Elastic Bandages & Supports (COMFORT FIT MATERNITY SUPP SM) MISC Wear as directed. (Patient not taking: Reported on 11/07/2020) 1 each 0  . escitalopram (LEXAPRO) 5 MG tablet Take 5 mg by mouth daily. (Patient not taking: Reported on 10/11/2020)    . esomeprazole (NEXIUM) 40 MG capsule Take 40 mg by mouth daily. (Patient not taking: Reported on 11/07/2020)    . hydrOXYzine (ATARAX/VISTARIL) 25 MG tablet Take 1 tablet (25 mg total) by mouth every 8 (eight) hours as needed for anxiety. Start with half of a tablet to  assess drowsiness and functioning. Do not drive after taking. 60 tablet 0  . Prenatal Vit-Fe Fumarate-FA (PRENATAL MULTIVITAMIN) TABS tablet Take 1 tablet by mouth daily at 12 noon.     No current facility-administered medications for this visit.    Review of Systems Review of Systems Constitutional: negative for fatigue and weight loss Respiratory: negative for cough and wheezing Cardiovascular: negative for chest pain, fatigue and palpitations Gastrointestinal: negative for abdominal pain and change in bowel habits Genitourinary: positive for urinary frequency and burning Integument/breast: negative for nipple discharge Musculoskeletal:negative for myalgias Neurological: negative for gait problems and tremors Behavioral/Psych: negative for abusive relationship, depression Endocrine: negative for temperature intolerance      Blood pressure 135/81, pulse 68, weight 153 lb (69.4 kg), not currently breastfeeding.  Physical Exam Physical Exam  PE:   General:   alert and no distress  Skin:   no rash or abnormalities  Lungs:   clear to auscultation bilaterally  Heart:   regular rate and rhythm, S1, S2 normal, no murmur, click, rub or gallop  Breasts:   normal without suspicious masses, skin or nipple changes or axillary nodes  Abdomen:  normal findings: no organomegaly, soft, non-tender and no hernia    50% of 15 min visit spent on counseling and coordination of care.   Data Reviewed Urine Dip-Stik  Assessment     1.  Dysuria Rx: - POCT Urinalysis Dipstick - Urine Culture - cefUROXime (CEFTIN) 500 MG tablet; Take 1 tablet (500 mg total) by mouth 2 (two) times daily with a meal.  Dispense: 14 tablet; Refill: 0  2. Thrombocytopenia ([place]) Rx: - CBC; Future  Plan   Follow up in 2 weeks for 4 week postpartum visit  Orders Placed This Encounter  Procedures  . Urine Culture  . POCT Urinalysis Dipstick   Meds ordered this encounter  Medications  . cefUROXime (CEFTIN) 500  MG tablet    Sig: Take 1 tablet (500 mg total) by mouth 2 (two) times daily with a meal.    Dispense:  14 tablet    Refill:  0    Shelly Bombard, MD 11/22/2020 4:37 PM

## 2020-11-22 NOTE — Progress Notes (Signed)
2 weeks PP presents for urinary frequency, burning.

## 2020-11-24 LAB — URINE CULTURE

## 2020-11-26 ENCOUNTER — Emergency Department (HOSPITAL_COMMUNITY): Payer: Medicaid Other

## 2020-11-26 ENCOUNTER — Encounter (HOSPITAL_COMMUNITY): Payer: Self-pay

## 2020-11-26 ENCOUNTER — Other Ambulatory Visit: Payer: Self-pay

## 2020-11-26 DIAGNOSIS — R0789 Other chest pain: Secondary | ICD-10-CM | POA: Insufficient documentation

## 2020-11-26 DIAGNOSIS — Z79899 Other long term (current) drug therapy: Secondary | ICD-10-CM | POA: Insufficient documentation

## 2020-11-26 DIAGNOSIS — R0602 Shortness of breath: Secondary | ICD-10-CM | POA: Diagnosis not present

## 2020-11-26 NOTE — ED Triage Notes (Signed)
Patient arrived with complaints of dull left sided chest pain that started today. States it feels like spasms. Also reporting some shortness of breath

## 2020-11-27 ENCOUNTER — Emergency Department (HOSPITAL_COMMUNITY)
Admission: EM | Admit: 2020-11-27 | Discharge: 2020-11-27 | Disposition: A | Discharge status: Home / Self Care | Payer: Medicaid Other | Attending: Emergency Medicine | Admitting: Emergency Medicine

## 2020-11-27 DIAGNOSIS — R0789 Other chest pain: Secondary | ICD-10-CM

## 2020-11-27 LAB — BASIC METABOLIC PANEL
Anion gap: 9 (ref 5–15)
BUN: 13 mg/dL (ref 6–20)
CO2: 22 mmol/L (ref 22–32)
Calcium: 9.1 mg/dL (ref 8.9–10.3)
Chloride: 107 mmol/L (ref 98–111)
Creatinine, Ser: 0.91 mg/dL (ref 0.44–1.00)
GFR, Estimated: >60 mL/min (ref >60)
Glucose, Bld: 101 mg/dL — ABNORMAL HIGH (ref 70–99)
Potassium: 3.9 mmol/L (ref 3.5–5.1)
Sodium: 138 mmol/L (ref 135–145)

## 2020-11-27 LAB — CBC
HCT: 39.2 % (ref 36.0–46.0)
Hemoglobin: 12.5 g/dL (ref 12.0–15.0)
MCH: 27.4 pg (ref 26.0–34.0)
MCHC: 31.9 g/dL (ref 30.0–36.0)
MCV: 85.8 fL (ref 80.0–100.0)
Platelets: 162 10*3/uL (ref 150–400)
RBC: 4.57 MIL/uL (ref 3.87–5.11)
RDW: 13.1 % (ref 11.5–15.5)
WBC: 6.3 10*3/uL (ref 4.0–10.5)
nRBC: 0 % (ref 0.0–0.2)

## 2020-11-27 LAB — TROPONIN I (HIGH SENSITIVITY)
Troponin I (High Sensitivity): <2 ng/L (ref <18)
Troponin I (High Sensitivity): <2 ng/L (ref <18)

## 2020-11-27 MED ORDER — ACETAMINOPHEN 500 MG PO TABS
1000.0000 mg | ORAL_TABLET | Freq: Once | ORAL | Status: AC
  Administered 2020-11-27: 1000 mg via ORAL
  Filled 2020-11-27: qty 2

## 2020-11-27 NOTE — ED Provider Notes (Addendum)
[facility] DEPT Provider Note  CSN: [id number] Arrival date & time: 11/26/20 2315  Chief Complaint(s) No chief complaint on file.  HPI Patricia Valencia is a 34 y.o. female  With a past medical history listed below including gestational hypertension on amlodipine preeclampsia She is here for left L4 parasternal chest pressure Patient noted the pain this morning. Described as a squeezing/spastic pain. Constant and fluctuating Exacerbated with movement and palpation Mild shortness of breath when pain is severe No recent fevers or infections No coughing or congestion No nausea or vomiting No abdominal pain No other physical complaints.    HPI  Past Medical History Past Medical History:  Diagnosis Date  . Female stress incontinence 06/14/2020  . Hypertension    Gestational  . Vaginal Pap smear, abnormal    Patient Active Problem List   Diagnosis Date Noted  . Vaginal delivery 11/08/2020  . Severe preeclampsia 11/08/2020  . Encounter for induction of labor 11/07/2020  . Supervision of high risk pregnancy, antepartum 10/19/2020  . Late prenatal care affecting pregnancy in third trimester 10/19/2020  . History of preterm delivery 10/19/2020  . Depressed mood 10/19/2020  . Rh negative status during pregnancy 10/11/2020  . History of oligohydramnios in prior pregnancy, currently pregnant, third trimester 10/11/2020  . 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 Cervical cerclage suture present 10/11/2020  . Thrombocytopenia ([place]) 09/29/2020  . History of pre-eclampsia 05/29/2020   Home Medication(s) Prior to Admission medications   Medication Sig Start Date End Date Taking? Authorizing Provider  acetaminophen (TYLENOL) 325 MG tablet Take 2 tablets (650 mg total) by mouth every 4 (four) hours as needed (for pain scale < 4ORtemperature>/=100.5 F). 11/10/20   Aletha Halim, MD  amLODipine (NORVASC) 5 MG  tablet Take 1 tablet (5 mg total) by mouth daily. 11/11/20 12/23/20  Aletha Halim, MD  cefUROXime (CEFTIN) 500 MG tablet Take 1 tablet (500 mg total) by mouth 2 (two) times daily with a meal. 11/22/20   Shelly Bombard, MD  Elastic Bandages & Supports (COMFORT FIT MATERNITY SUPP SM) MISC Wear as directed. Patient not taking: Reported on 11/07/2020 10/11/20   Shelly Bombard, MD  escitalopram (LEXAPRO) 5 MG tablet Take 5 mg by mouth daily. Patient not taking: Reported on 10/11/2020 08/02/20   [provider]  esomeprazole (NEXIUM) 40 MG capsule Take 40 mg by mouth daily. Patient not taking: Reported on 11/07/2020 08/11/20   [provider]  hydrOXYzine (ATARAX/VISTARIL) 25 MG tablet Take 1 tablet (25 mg total) by mouth every 8 (eight) hours as needed for anxiety. Start with half of a tablet to assess drowsiness and functioning. Do not drive after taking. 11/10/20   Starr Lake, CNM  Prenatal Vit-Fe Fumarate-FA (PRENATAL MULTIVITAMIN) TABS tablet Take 1 tablet by mouth daily at 12 noon.    [provider]  Past Surgical History Past Surgical History:  Procedure Laterality Date  . CERVICAL CERCLAGE     Family History Family History  Problem Relation Age of Onset  . Hypertension Mother   . Hypertension Father     Social History Social History   Tobacco Use  . Smoking status: Never Smoker  . Smokeless tobacco: Never Used  Vaping Use  . Vaping Use: Never used  Substance Use Topics  . Alcohol use: Never  . Drug use: Never   Allergies Sulfa antibiotics  Review of Systems Review of Systems All other systems are reviewed and are negative for acute change except as noted in the HPI  Physical Exam Vital Signs  I have reviewed the triage vital signs BP (!) 128/93   Pulse 66   Temp 98.2 F (36.8 C) (Oral)    Resp 13   SpO2 100%   Physical Exam Vitals reviewed.  Constitutional:      General: She is not in acute distress.    Appearance: She is well-developed. She is not diaphoretic.  HENT:     Head: Normocephalic and atraumatic.     Nose: Nose normal.  Eyes:     General: No scleral icterus.       Right eye: No discharge.        Left eye: No discharge.     Conjunctiva/sclera: Conjunctivae normal.     Pupils: Pupils are equal, round, and reactive to light.  Cardiovascular:     Rate and Rhythm: Normal rate and regular rhythm.     Heart sounds: No murmur heard.  No friction rub. No gallop.   Pulmonary:     Effort: Pulmonary effort is normal. No respiratory distress.     Breath sounds: Normal breath sounds. No stridor. No rales.  Chest:     Chest wall: Tenderness present.    Abdominal:     General: There is no distension.     Palpations: Abdomen is soft.     Tenderness: There is no abdominal tenderness.  Musculoskeletal:        General: No tenderness.     Cervical back: Normal range of motion and neck supple.  Skin:    General: Skin is warm and dry.     Findings: No erythema or rash.  Neurological:     Mental Status: She is alert and oriented to person, place, and time.     ED Results and Treatments Labs (all labs ordered are listed, but only abnormal results are displayed) Labs Reviewed  BASIC METABOLIC PANEL - Abnormal; Notable for the following components:      Result Value   Glucose, Bld 101 (*)    All other components within normal limits  CBC  TROPONIN I (HIGH SENSITIVITY)  TROPONIN I (HIGH SENSITIVITY)                                                                                                                         EKG  EKG  Interpretation  Date/Time:  Monday November 27 2020 01:55:20 EST Ventricular Rate:  75 PR Interval:    QRS Duration: 80 QT Interval:  392 QTC Calculation: 438 R Axis:   57 Text Interpretation: Sinus rhythm Low voltage, precordial  leads No acute changes Confirmed by Addison Lank [phone]) on 11/27/2020 2:13:39 AM      Radiology DG Chest 2 View  Result Date: 11/26/2020 CLINICAL DATA:  Left-sided chest pain for 1 day EXAM: CHEST - 2 VIEW COMPARISON:  07/17/2020 FINDINGS: The heart size and mediastinal contours are within normal limits. Both lungs are clear. The visualized skeletal structures are unremarkable. IMPRESSION: No active cardiopulmonary disease. Electronically Signed   By: Randa Ngo M.D.   On: 11/26/2020 23:54    Pertinent labs & imaging results that were available during my care of the patient were reviewed by me and considered in my medical decision making (see chart for details).  Medications Ordered in ED Medications  acetaminophen (TYLENOL) tablet 1,000 mg (1,000 mg Oral Given 11/27/20 0111)                                                                                                                                    Procedures .1-3 Lead EKG Interpretation Performed by: Fatima Blank, MD Authorized by: Fatima Blank, MD     Interpretation: normal     ECG rate:  73   ECG rate assessment: normal     Rhythm: sinus rhythm     Ectopy: none     Conduction: normal      (including critical care time)  Medical Decision Making / ED Course I have reviewed the nursing notes for this encounter and the patient's prior records (if available in EHR or on provided paperwork).   Patricia Valencia was evaluated in Emergency Department on 11/27/2020 for the symptoms described in the history of present illness. She was evaluated in the context of the global COVID-19 pandemic, which necessitated consideration that the patient might be at risk for infection with the SARS-CoV-2 virus that causes COVID-19. Institutional protocols and algorithms that pertain to the evaluation of patients at risk for COVID-19 are in a state of rapid change based on information released by regulatory bodies  including the CDC and federal and state organizations. These policies and algorithms were followed during the patient's care in the ED.  Atypical chest pain most consistent with MSK given the point tenderness EKG without acute ischemic changes or evidence of pericarditis. troponin negative x2 Presentation is highly inconsistent with ACS.  Low suspicion for pulmonary embolism.  Presentation not classic for aortic dissection or esophageal perforation.  Chest x-ray without evidence suggestive of pneumonia, pneumothorax, pneumomediastinum.  No abnormal contour of the mediastinum to suggest dissection. No evidence of acute injuries.  Labs grossly reassuring without leukocytosis or anemia.  Platelet count is improving from most recent labs. No significant electrolyte derangements or renal sufficiency.  Supportive management recommended.     Final Clinical Impression(s) / ED Diagnoses Final diagnoses:  Chest wall pain    The patient appears reasonably screened and/or stabilized for discharge and I doubt any other medical condition or other [facility] requiring further screening, evaluation, or treatment in the ED at this time prior to discharge. Safe for discharge with strict return precautions.  Disposition: Discharge  Condition: Good  I have discussed the results, Dx and Tx plan with the patient/family who expressed understanding and agree(s) with the plan. Discharge instructions discussed at length. The patient/family was given strict return precautions who verbalized understanding of the instructions. No further questions at time of discharge.    ED Discharge Orders    None      Follow Up: Primary care provider  Call  To schedule an appointment for close follow up     This chart was dictated using voice recognition software.  Despite best efforts to proofread,  errors can occur which can change the documentation meaning.     Fatima Blank, MD 11/27/20 [phone]

## 2020-11-28 ENCOUNTER — Ambulatory Visit: Payer: Medicaid Other

## 2020-11-29 ENCOUNTER — Telehealth: Payer: Self-pay | Admitting: Obstetrics

## 2020-11-29 NOTE — Telephone Encounter (Signed)
Patient needs postpartum visit before returning to work.

## 2020-11-29 NOTE — Telephone Encounter (Signed)
Advised patient that she will need to have postpartum completed for returning to work. Patient verbalized understanding.  Derl Barrow, RN

## 2020-11-29 NOTE — Telephone Encounter (Signed)
Patient left voice message requesting a return to note work for this weekend. Patient has not had postpartum appointment. PP appt scheduled for 12/06/20. Patient's job is requesting the note. Please advise.  Derl Barrow, RN

## 2020-11-30 ENCOUNTER — Other Ambulatory Visit: Payer: Self-pay

## 2020-11-30 ENCOUNTER — Ambulatory Visit: Payer: Medicaid Other | Admitting: Podiatry

## 2020-11-30 ENCOUNTER — Ambulatory Visit (INDEPENDENT_AMBULATORY_CARE_PROVIDER_SITE_OTHER): Payer: Medicaid Other

## 2020-11-30 DIAGNOSIS — M2042 Other hammer toe(s) (acquired), left foot: Secondary | ICD-10-CM | POA: Diagnosis not present

## 2020-11-30 DIAGNOSIS — M2031 Hallux varus (acquired), right foot: Secondary | ICD-10-CM

## 2020-11-30 DIAGNOSIS — B351 Tinea unguium: Secondary | ICD-10-CM | POA: Diagnosis not present

## 2020-11-30 DIAGNOSIS — M2041 Other hammer toe(s) (acquired), right foot: Secondary | ICD-10-CM

## 2020-11-30 DIAGNOSIS — M2032 Hallux varus (acquired), left foot: Secondary | ICD-10-CM

## 2020-11-30 MED ORDER — TERBINAFINE HCL 250 MG PO TABS: 250.0000 mg | ORAL_TABLET | Freq: Every day | ORAL | 0 refills | Status: AC

## 2020-12-03 NOTE — Progress Notes (Signed)
Subjective:  Patient ID: Patricia Valencia, female    DOB: Nov 19, 1986,  MRN: [id number]  Chief Complaint  Patient presents with  . surgery consult    Pt states she interested in surgery    34 y.o. female returns with the above complaint. History confirmed with patient.  She is here to discuss surgery now that she has given birth.  She is not breast-feeding.  She would also be interested in treatment for nail fungus which we discussed at last visit as well.  Both feet hurt but the right side is worse for her.  Heel pain has resolved.  Objective:  Physical Exam: warm, good capillary refill, no trophic changes or ulcerative lesions, normal DP and PT pulses and normal sensory exam.  Right hallux nail with brown-yellow discoloration, appears consistent with onychomycosis. Digital contractures with prominent extensor tendons and hallux malleus, plantarflexed first ray bilaterally.    Radiographs of both feet are taken: On the left there is mild hallux valgus and hallux interphalangeus with contracture of the hallux IPJ as well.  Second hammertoe formation.  On the right there is no hallux valgus but mild hallux interphalangeus as well as second hammertoe formation Assessment:   1. Onychomycosis   2. Hammertoe of left foot   3. Hammertoe of right foot   4. Hallux malleus of left foot   5. Hallux malleus, right      Plan:  Patient was evaluated and treated and all questions answered.     -She would like to treat the onychomycosis with terbinafine.  Prescription sent to pharmacy.  No history of liver disease and her last LFTs were normal.  - For digital contractures and hallux malleus, we again discussed surgical and nonsurgical treatment options including shoe gear changes padding offloading and surgical correction.  She is interested in surgery.  We discussed the risk, benefits and potential complications or could arise from this including pain, swelling, infection, scar, numbness  which may be temporary or permanent, chronic pain, stiffness, nerve pain or damage, wound healing problems, bone healing problems including delayed or non-union.  She would like to treat the right foot first.  Her primary complaint is the second toe as well as the great toe both over the proximal IPJ's where she is getting irritation of the skin and early corns.  I recommended we treat with PIPJ arthrodesis and hammertoe correction of the second toe, for the hallux she has a reducible IPJ with no evidence of arthritis and mild hallux interphalangeus.  I recommended we proceed with an Akin osteotomy as well as an arthroplasty of the IPJ.  We discussed the pros and cons of this versus proceeding with an arthrodesis of the joint and she would like to avoid fusion at this point and I think that would be a good idea.  If it does not lead to full correction we could consider this in the future.  All questions were addressed.  Informed consent was signed.  Surgery be scheduled at a mutually agreeable date.  She would like to wait till February.   Surgical plan:  Procedure: -Right foot Akin osteotomy and second hammertoe correction  Location: -[place]  Anesthesia plan: -IV sedation with local anesthesia  Postoperative pain plan: -Tylenol 1000 mg every 6 hours, ibuprofen 600 mg every 6 hours, gabapentin 300 mg every 8 hours x5 days, oxycodone 5 mg 1-2 tabs every 6 hours only as needed  DVT prophylaxis: -None required  WB Restrictions / DME needs: -  WBAT in surgical shoe will be dispensed at surgical center       No follow-ups on file.

## 2020-12-06 ENCOUNTER — Ambulatory Visit (INDEPENDENT_AMBULATORY_CARE_PROVIDER_SITE_OTHER): Payer: Medicaid Other | Admitting: Obstetrics & Gynecology

## 2020-12-06 ENCOUNTER — Encounter: Payer: Self-pay | Admitting: Obstetrics & Gynecology

## 2020-12-06 ENCOUNTER — Other Ambulatory Visit: Payer: Self-pay

## 2020-12-06 DIAGNOSIS — R87810 Cervical high risk human papillomavirus (HPV) DNA test positive: Secondary | ICD-10-CM

## 2020-12-06 DIAGNOSIS — R87610 Atypical squamous cells of undetermined significance on cytologic smear of cervix (ASC-US): Secondary | ICD-10-CM

## 2020-12-06 DIAGNOSIS — O1093 Unspecified pre-existing hypertension complicating the puerperium: Secondary | ICD-10-CM

## 2020-12-06 DIAGNOSIS — O0990 Supervision of high risk pregnancy, unspecified, unspecified trimester: Secondary | ICD-10-CM

## 2020-12-06 NOTE — Patient Instructions (Signed)
Colposcopy Colposcopy is a procedure to examine the lowest part of the uterus (cervix), the vagina, and the area around the vaginal opening (vulva) for abnormalities or signs of disease. The procedure is done using a lighted microscope or magnifying lens (colposcope). If any unusual cells are found during the procedure, your health care provider may remove a tissue sample for testing (biopsy). A colposcopy may be done if you:  Have an abnormal Pap test. A Pap test is a screening test that is used to check for signs of cancer or infection of the vagina, cervix, and uterus.  Have a Pap smear test in which you test positive for high-risk HPV (human papillomavirus).  Have a sore or lesion on your cervix.  Have genital warts on your vulva, vagina, or cervix.  Took certain medicines while pregnant, such as diethylstilbestrol (DES).  Have pain during sexual intercourse.  Have vaginal bleeding, especially after sexual intercourse.  Need to have a cervical polyp removed.  Need to have a lost intrauterine device (IUD) string located. Let your health care provider know about:  Any allergies you have, including allergies to prescribed medicine, latex, or iodine.  All medicines you are taking, including vitamins, herbs, eye drops, creams, and over-the-counter medicines. Bring a list of all of your medicines to your appointment.  Any problems you or family members have had with anesthetic medicines.  Any blood disorders you have.  Any surgeries you have had.  Any medical conditions you have, such as pelvic inflammatory disease (PID) or endometrial disorder.  Any history of frequent fainting.  Your menstrual cycle and what form of birth control (contraception) you use.  Your medical history, including any prior cervical treatment.  Whether you are pregnant or may be pregnant. What are the risks? Generally, this is a safe procedure. However, problems may occur,  including:  Pain.  Infection, which may include a fever, bad-smelling discharge, or pelvic pain.  Bleeding or discharge.  Misdiagnosis.  Fainting and vasovagal reactions, but this is rare.  Allergic reactions to medicines.  Damage to other structures or organs. What happens before the procedure?  If you have your menstrual period or will have it at the time of your procedure, tell your health care provider. A colposcopy typically is not done during menstruation.  Continue your contraceptive practices before and after the procedure.  For 24 hours before the colposcopy: ? Do not douche. ? Do not use tampons. ? Do not use medicines, creams, or suppositories in the vagina. ? Do not have sexual intercourse.  Ask your health care provider about: ? Changing or stopping your regular medicines. This is especially important if you are taking diabetes medicines or blood thinners. ? Taking medicines such as aspirin and ibuprofen. These medicines can thin your blood. Do not take these medicines before your procedure if your health care provider instructs you not to. It is likely that your health care provider will tell you to avoid taking aspirin or medicine that contains aspirin for 7 days before the procedure.  Follow instructions from your health care provider about eating or drinking restrictions. You will likely need to eat a regular diet the day of the procedure and not skip any meals.  You may have an exam or testing. A pregnancy test will be taken on the day of the procedure.  You may have a blood or urine sample taken.  Plan to have someone take you home from the hospital or clinic.  If you will be going  home right after the procedure, plan to have someone with you for 24 hours. What happens during the procedure?  You will lie down on your back, with your feet in foot rests (stirrups).  A warmed and lubricated instrument (speculum) will be inserted into your vagina. The  speculum will be used to hold apart the walls of your vagina so your health care provider can see your cervix and the inside of your vagina.  A cotton swab will be used to place a small amount of liquid solution on the areas to be examined. This solution makes it easier to see abnormal cells. You may feel a slight burning during this part.  The colposcope will be used to scan the cervix with a bright white light. The colposcope will be held near your vulvaand will magnify your vulva, vagina, and cervix for easier examination.  Your health care provider may decide to take a biopsy. If so: ? You may be given medicine to numb the area (local anesthetic). ? Surgical instruments will be used to suck out mucus and cells through your vagina. ? You may feel mild pain while the tissue sample is removed. ? Bleeding may occur. A solution may be used to stop the bleeding. ? If a sample of tissue is needed from the inside of the cervix, a different procedure called endocervical curettage (ECC) may be completed. During this procedure, a curved instrument (curette) will be used to scrape cells from your cervix or the top of your cervix (endocervix).  Your health care provider will record the location of any abnormalities. The procedure may vary among health care providers and hospitals. What happens after the procedure?  You will lie down and rest for a few minutes. You may be offered juice or cookies.  Your blood pressure, heart rate, breathing rate, and blood oxygen level will be monitored until any medicines you were given have worn off.  You may have to wear compression stockings. These stockings help to prevent blood clots and reduce swelling in your legs.  You may have some cramping in your abdomen. This should go away after a few minutes. This information is not intended to replace advice given to you by your health care provider. Make sure you discuss any questions you have with your health care  provider. Document Revised: 11/21/2017 Document Reviewed: 07/15/2016 Elsevier Patient Education  [place].

## 2020-12-06 NOTE — Progress Notes (Signed)
Post Partum Visit Note  Patricia Valencia is a 34 y.o. [phone] female who presents for a postpartum visit. She is 4 weeks postpartum following a normal spontaneous vaginal delivery.  I have fully reviewed the prenatal and intrapartum course. The delivery was at 35.3 gestational weeks.  Anesthesia: epidural. Postpartum course has been unremarkable. Baby is doing well. Baby is feeding by bottle - Similac Neosure. Bleeding thin lochia. Bowel function is normal. Bladder function is normal. Patient is not sexually active. Contraception method is none. Postpartum depression screening: negative=3.   The pregnancy intention screening data noted above was reviewed. Potential methods of contraception were discussed. The patient elected to proceed with Oral Contraceptive.    Edinburgh Postnatal Depression Scale - 12/06/20 1316      Edinburgh Postnatal Depression Scale:  In the Past 7 Days   I have been able to laugh and see the funny side of things. 0    I have looked forward with enjoyment to things. 0    I have blamed myself unnecessarily when things went wrong. 0    I have been anxious or worried for no good reason. 3    I have felt scared or panicky for no good reason. 0    Things have been getting on top of me. 0    I have been so unhappy that I have had difficulty sleeping. 0    I have felt sad or miserable. 0    I have been so unhappy that I have been crying. 0    The thought of harming myself has occurred to me. 0    Edinburgh Postnatal Depression Scale Total 3            The following portions of the patient's history were reviewed and updated as appropriate: allergies, current medications, past family history, past medical history, past social history, past surgical history and problem list.  Review of Systems Pertinent items are noted in HPI.    Objective:  BP 121/79   Pulse 64   Ht 5\' 3"  (1.6 m)   Wt 156 lb (70.8 kg)   Breastfeeding No   BMI 27.63 kg/m    General:   alert, cooperative and no distress   Breasts:  not examined  Lungs: effort normal     Abdomen: not distended   Vulva:  not evaluated  Vagina: not evaluated                    Assessment:    normal postpartum exam. Pap smear not done at today's visit.   Plan:   Essential components of care per ACOG recommendations:  1.  Mood and well being: Patient with negative depression screening today. Reviewed local resources for support.  - Patient does not use tobacco. - hx of drug use? No   If yes, discussed support systems  2. Infant care and feeding:  -Patient currently breastmilk feeding? No If breastmilk feeding discussed return to work and pumping. If needed, patient was provided letter for work to allow for every 2-3 hr pumping breaks, and to be granted a private location to express breastmilk and refrigerated area to store breastmilk. Reviewed importance of draining breast regularly to support lactation. -Social determinants of health (SDOH) reviewed in EPIC. No   3. Sexuality, contraception and birth spacing - Patient does not want a pregnancy in the next year.  Desired family size is 3 children.  - Reviewed forms of contraception in tiered fashion. Patient  desired oral contraceptives (estrogen/progesterone) today.   - Discussed birth spacing of 18 months  4. Sleep and fatigue -Encouraged family/partner/community support of 4 hrs of uninterrupted sleep to help with mood and fatigue  5. Physical Recovery  - Discussed patients delivery  - Patient has urinary incontinence? No  - Patient is safe to resume physical and sexual activity  6.  Health Maintenance - Last pap smear done 05/2020 and was abnormal with ASCUS with positive HPV.  Colposcopy and Bx needed 7. HTN Chronic Disease - PCP follow up  [facility] for [employer], [facility]  Woodroe Mode, MD

## 2020-12-11 ENCOUNTER — Telehealth: Payer: Self-pay | Admitting: *Deleted

## 2020-12-11 NOTE — Telephone Encounter (Signed)
Pt called to office stating her BC pills were not sent to pharmacy.   Please review and send Rx that was discussed.

## 2020-12-12 ENCOUNTER — Other Ambulatory Visit: Payer: Self-pay | Admitting: *Deleted

## 2020-12-12 MED ORDER — NORETHIN ACE-ETH ESTRAD-FE 1-20 MG-MCG(24) PO TABS: 1.0000 | ORAL_TABLET | Freq: Every day | ORAL | 11 refills | Status: AC

## 2020-12-12 NOTE — Progress Notes (Signed)
Pt called to office stating BC pills were not sent in at time of visit.  Reviewed with Dr Rip Harbour, ok to send in Loestrin 1/20.  Pt made aware.

## 2021-01-09 ENCOUNTER — Encounter: Payer: Medicaid Other | Admitting: Obstetrics & Gynecology

## 2021-01-09 ENCOUNTER — Encounter: Payer: Self-pay | Admitting: Obstetrics & Gynecology

## 2021-01-09 DIAGNOSIS — R87810 Cervical high risk human papillomavirus (HPV) DNA test positive: Secondary | ICD-10-CM | POA: Insufficient documentation

## 2021-01-09 DIAGNOSIS — R87610 Atypical squamous cells of undetermined significance on cytologic smear of cervix (ASC-US): Secondary | ICD-10-CM | POA: Insufficient documentation

## 2021-01-24 ENCOUNTER — Encounter (HOSPITAL_BASED_OUTPATIENT_CLINIC_OR_DEPARTMENT_OTHER): Payer: Self-pay | Admitting: Podiatry

## 2021-01-24 ENCOUNTER — Other Ambulatory Visit (HOSPITAL_COMMUNITY)
Admission: RE | Admit: 2021-01-24 | Discharge: 2021-01-24 | Disposition: A | Discharge status: Home / Self Care | Payer: Medicaid Other | Source: Ambulatory Visit | Attending: Podiatry | Admitting: Podiatry

## 2021-01-24 ENCOUNTER — Other Ambulatory Visit: Payer: Self-pay

## 2021-01-24 DIAGNOSIS — Z8616 Personal history of COVID-19: Secondary | ICD-10-CM

## 2021-01-24 DIAGNOSIS — U071 COVID-19: Secondary | ICD-10-CM | POA: Diagnosis not present

## 2021-01-24 DIAGNOSIS — Z01812 Encounter for preprocedural laboratory examination: Secondary | ICD-10-CM | POA: Diagnosis not present

## 2021-01-24 HISTORY — DX: Personal history of COVID-19: Z86.16

## 2021-01-24 LAB — SARS CORONAVIRUS 2 (TAT 6-24 HRS): SARS Coronavirus 2: POSITIVE — AB

## 2021-01-24 NOTE — Progress Notes (Signed)
Spoke w/ via phone for pre-op interview--- PT Lab needs dos----Urine preg               Lab results------ no COVID test ------ done 01-24-2021 result in epic Arrive at ------- 0800 NPO after MN NO Solid Food.  Clear liquids from MN until--- 0700 Medications to take morning of surgery ----- NONE Diabetic medication ----- n/a Patient Special Instructions ----- n/a Pre-Op special Istructions ----- have not received pt's pcp h&p.  Pt will have her daughter with her dos since she, Laurine Blazer, is having surgery to follow her mother. Pt stated she has a friend that will drop off, pick-up and be caregiver for both her and her daughter  Patient verbalized understanding of instructions that were given at this phone interview. Patient denies shortness of breath, chest pain, fever, cough at this phone interview.

## 2021-01-24 NOTE — Progress Notes (Signed)
Pt sts she tested + for covid on 01/02/21 through a weekly rapid test at her place of employment. Pt does not have proof, but sts she has since tested negative. Based on the results of this test it will be up to the surgeon to proceed with the procedure.

## 2021-01-25 ENCOUNTER — Telehealth: Payer: Self-pay | Admitting: Podiatry

## 2021-01-25 ENCOUNTER — Telehealth: Payer: Self-pay | Admitting: *Deleted

## 2021-01-25 NOTE — Telephone Encounter (Signed)
Called to discuss with patient about COVID-19 symptoms and the use of one of the available treatments for those with mild to moderate Covid symptoms and at a high risk of hospitalization.  Pt appears to qualify for outpatient treatment due to co-morbid conditions and/or a member of an at-risk group in accordance with the FDA Emergency Use Authorization.    Symptom onset:  Vaccinated:  Booster?  Immunocompromised?  Qualifiers:   Unable to reach pt -No answer and unable to leave VM.  Patricia Valencia

## 2021-01-25 NOTE — Telephone Encounter (Signed)
Called to discuss with patient about COVID-19 symptoms and the use of one of the available treatments for those with mild to moderate Covid symptoms and at a high risk of hospitalization.  Pt appears to qualify for outpatient treatment due to co-morbid conditions and/or a member of an at-risk group in accordance with the FDA Emergency Use Authorization.   No call made to patient at this time. I will try later today after physician contacts the patient regarding upcoming procedure and positive Covid results.    Symptom onset:  Vaccinated:  Booster?  Immunocompromised?  Qualifiers:   Unable to reach pt - Try the patient later today.    Tarry Kos

## 2021-01-25 NOTE — Progress Notes (Signed)
Patricia Valencia, with Dr. Maxie Barb office with + covid results for this pt. The pt is to have surgery on 01/26/21 at [facility] at 10:00 AM. The pt has not been notified as there will be pertinent questions the provider needs to answer.  These are the current guidelines:  Positive Results for:  Asymptomatic: Procedure postponed and quarantined for 10 days, unless the procedure is urgent. Symptomatic: Procedure postponed and quarantined for 14 days. Hospitalized with Covid: postponed and quarantined  for 21 days. Immunocompromised: Procedure postponed and quarantine for 20 days.  The pt will not be retested for 90 days from the + result.

## 2021-01-25 NOTE — Telephone Encounter (Signed)
Altha Harm from [facility] called to say patient tested positive for Covid 01/24/2021. She is scheduled for surgery 01/26/2021.

## 2021-01-25 NOTE — Telephone Encounter (Signed)
Thanks, will discuss with [facility]

## 2021-02-01 ENCOUNTER — Encounter: Payer: Medicaid Other | Admitting: Podiatry

## 2021-02-02 ENCOUNTER — Encounter: Payer: Medicaid Other | Admitting: Podiatry

## 2021-02-08 ENCOUNTER — Encounter: Payer: Medicaid Other | Admitting: Podiatry

## 2021-02-22 ENCOUNTER — Encounter: Payer: Medicaid Other | Admitting: Podiatry

## 2021-03-07 ENCOUNTER — Encounter (HOSPITAL_BASED_OUTPATIENT_CLINIC_OR_DEPARTMENT_OTHER): Payer: Self-pay | Admitting: Podiatry

## 2021-03-07 ENCOUNTER — Other Ambulatory Visit: Payer: Self-pay

## 2021-03-07 NOTE — Progress Notes (Addendum)
ADDENDUM:  Dr Sandi Mariscal is name of doctor that signed pt's H&P, per Chelle from Dr March Rummage office.  ADDENDUM:  Received pt's pcp H&P dated 03-08-2021 via fax.  Unable to read whom signed it.  Called and left message with Chelle, OR scheduler for Dr Sherryle Lis, inquired what is the name.  Spoke w/ via phone for pre-op interview--- PT Lab needs dos----Urine preg               Lab results------ positive covid test result dated 01-24-2021 in epic COVID test ------ no test needed since it is within 90 day window per policy.  Pt stated was asymptomatic Arrive at ------- 0830 on 03-16-2021 NPO after MN NO Solid Food.  Clear liquids from MN until--- 0730 Medications to take morning of surgery ----- NONE Diabetic medication ----- n/a Patient Special Instructions ----- n/a Pre-Op special Istructions ----- have not received pt's pcp h&p.  Pt stated her sister, Olivia Mackie will be driver/ caregiver dos Patient verbalized understanding of instructions that were given at this phone interview. Patient denies shortness of breath, chest pain, fever, cough at this phone interview.

## 2021-03-16 ENCOUNTER — Ambulatory Visit (HOSPITAL_BASED_OUTPATIENT_CLINIC_OR_DEPARTMENT_OTHER): Payer: Medicaid Other | Admitting: Anesthesiology

## 2021-03-16 ENCOUNTER — Encounter (HOSPITAL_BASED_OUTPATIENT_CLINIC_OR_DEPARTMENT_OTHER)
Admission: RE | Disposition: A | Discharge status: Home / Self Care | Payer: Self-pay | Source: Home / Self Care | Attending: Podiatry

## 2021-03-16 ENCOUNTER — Ambulatory Visit (HOSPITAL_COMMUNITY): Payer: Medicaid Other

## 2021-03-16 ENCOUNTER — Encounter (HOSPITAL_BASED_OUTPATIENT_CLINIC_OR_DEPARTMENT_OTHER): Payer: Self-pay | Admitting: Podiatry

## 2021-03-16 ENCOUNTER — Ambulatory Visit (HOSPITAL_BASED_OUTPATIENT_CLINIC_OR_DEPARTMENT_OTHER)
Admission: RE | Admit: 2021-03-16 | Discharge: 2021-03-16 | Disposition: A | Discharge status: Home / Self Care | Payer: Medicaid Other | Attending: Podiatry | Admitting: Podiatry

## 2021-03-16 DIAGNOSIS — M898X7 Other specified disorders of bone, ankle and foot: Secondary | ICD-10-CM

## 2021-03-16 DIAGNOSIS — M2041 Other hammer toe(s) (acquired), right foot: Secondary | ICD-10-CM | POA: Diagnosis not present

## 2021-03-16 DIAGNOSIS — M205X1 Other deformities of toe(s) (acquired), right foot: Secondary | ICD-10-CM | POA: Insufficient documentation

## 2021-03-16 DIAGNOSIS — M2011 Hallux valgus (acquired), right foot: Secondary | ICD-10-CM | POA: Diagnosis not present

## 2021-03-16 DIAGNOSIS — M216X1 Other acquired deformities of right foot: Secondary | ICD-10-CM

## 2021-03-16 DIAGNOSIS — Z9889 Other specified postprocedural states: Secondary | ICD-10-CM

## 2021-03-16 DIAGNOSIS — M899 Disorder of bone, unspecified: Secondary | ICD-10-CM | POA: Diagnosis not present

## 2021-03-16 HISTORY — PX: HAMMER TOE SURGERY: SHX385

## 2021-03-16 HISTORY — DX: Personal history of other complications of pregnancy, childbirth and the puerperium: Z87.59

## 2021-03-16 HISTORY — DX: Herpesviral infection, unspecified: B00.9

## 2021-03-16 HISTORY — DX: Personal history of other diseases of the female genital tract: Z87.42

## 2021-03-16 LAB — POCT PREGNANCY, URINE: Preg Test, Ur: NEGATIVE

## 2021-03-16 SURGERY — CORRECTION, HAMMER TOE
Anesthesia: Monitor Anesthesia Care | Site: Toe | Laterality: Right

## 2021-03-16 MED ORDER — MIDAZOLAM HCL 2 MG/2ML IJ SOLN
INTRAMUSCULAR | Status: AC
  Filled 2021-03-16: qty 2

## 2021-03-16 MED ORDER — OXYCODONE HCL 5 MG PO TABS: 5.0000 mg | ORAL_TABLET | ORAL | 0 refills | Status: AC | PRN

## 2021-03-16 MED ORDER — CEFAZOLIN SODIUM-DEXTROSE 2-4 GM/100ML-% IV SOLN
INTRAVENOUS | Status: AC
  Filled 2021-03-16: qty 100

## 2021-03-16 MED ORDER — LIDOCAINE HCL 2 % IJ SOLN
INTRAMUSCULAR | Status: DC | PRN
  Administered 2021-03-16: 7.5 mL

## 2021-03-16 MED ORDER — BUPIVACAINE HCL (PF) 0.5 % IJ SOLN
INTRAMUSCULAR | Status: DC | PRN
  Administered 2021-03-16: 7.5 mL

## 2021-03-16 MED ORDER — LACTATED RINGERS IV SOLN: INTRAVENOUS | Status: DC

## 2021-03-16 MED ORDER — GLYCOPYRROLATE PF 0.2 MG/ML IJ SOSY
PREFILLED_SYRINGE | INTRAMUSCULAR | Status: DC | PRN
  Administered 2021-03-16: .2 mg via INTRAVENOUS

## 2021-03-16 MED ORDER — HYDROMORPHONE HCL 1 MG/ML IJ SOLN: 0.2500 mg | INTRAMUSCULAR | Status: DC | PRN

## 2021-03-16 MED ORDER — OXYCODONE HCL 5 MG/5ML PO SOLN: 5.0000 mg | Freq: Once | ORAL | Status: DC | PRN

## 2021-03-16 MED ORDER — ONDANSETRON HCL 4 MG/2ML IJ SOLN
INTRAMUSCULAR | Status: DC | PRN
  Administered 2021-03-16: 4 mg via INTRAVENOUS

## 2021-03-16 MED ORDER — FENTANYL CITRATE (PF) 100 MCG/2ML IJ SOLN
INTRAMUSCULAR | Status: AC
  Filled 2021-03-16: qty 2

## 2021-03-16 MED ORDER — LIDOCAINE 2% (20 MG/ML) 5 ML SYRINGE
INTRAMUSCULAR | Status: DC | PRN
  Administered 2021-03-16: 60 mg via INTRAVENOUS

## 2021-03-16 MED ORDER — ONDANSETRON HCL 4 MG/2ML IJ SOLN
INTRAMUSCULAR | Status: AC
  Filled 2021-03-16: qty 2

## 2021-03-16 MED ORDER — ACETAMINOPHEN 500 MG PO TABS
1000.0000 mg | ORAL_TABLET | Freq: Once | ORAL | Status: AC
  Administered 2021-03-16: 1000 mg via ORAL

## 2021-03-16 MED ORDER — IBUPROFEN 600 MG PO TABS: 600.0000 mg | ORAL_TABLET | Freq: Four times a day (QID) | ORAL | 0 refills | Status: AC | PRN

## 2021-03-16 MED ORDER — PROPOFOL 500 MG/50ML IV EMUL
INTRAVENOUS | Status: AC
  Filled 2021-03-16: qty 50

## 2021-03-16 MED ORDER — ACETAMINOPHEN 500 MG PO TABS
ORAL_TABLET | ORAL | Status: AC
  Filled 2021-03-16: qty 2

## 2021-03-16 MED ORDER — DEXMEDETOMIDINE (PRECEDEX) IN NS 20 MCG/5ML (4 MCG/ML) IV SYRINGE
PREFILLED_SYRINGE | INTRAVENOUS | Status: DC | PRN
  Administered 2021-03-16: 20 ug via INTRAVENOUS

## 2021-03-16 MED ORDER — FENTANYL CITRATE (PF) 100 MCG/2ML IJ SOLN
INTRAMUSCULAR | Status: DC | PRN
  Administered 2021-03-16 (×4): 25 ug via INTRAVENOUS

## 2021-03-16 MED ORDER — KETOROLAC TROMETHAMINE 30 MG/ML IJ SOLN
INTRAMUSCULAR | Status: DC | PRN
  Administered 2021-03-16: 30 mg via INTRAVENOUS

## 2021-03-16 MED ORDER — PROPOFOL 500 MG/50ML IV EMUL
INTRAVENOUS | Status: DC | PRN
  Administered 2021-03-16: 125 ug/kg/min via INTRAVENOUS
  Administered 2021-03-16: 40 mg via INTRAVENOUS

## 2021-03-16 MED ORDER — ACETAMINOPHEN 500 MG PO TABS: 1000.0000 mg | ORAL_TABLET | Freq: Four times a day (QID) | ORAL | 0 refills | Status: AC | PRN

## 2021-03-16 MED ORDER — PROMETHAZINE HCL 25 MG/ML IJ SOLN: 6.2500 mg | INTRAMUSCULAR | Status: DC | PRN

## 2021-03-16 MED ORDER — MIDAZOLAM HCL 5 MG/5ML IJ SOLN
INTRAMUSCULAR | Status: DC | PRN
  Administered 2021-03-16: 2 mg via INTRAVENOUS

## 2021-03-16 MED ORDER — KETOROLAC TROMETHAMINE 30 MG/ML IJ SOLN
INTRAMUSCULAR | Status: AC
  Filled 2021-03-16: qty 1

## 2021-03-16 MED ORDER — OXYCODONE HCL 5 MG PO TABS: 5.0000 mg | ORAL_TABLET | Freq: Once | ORAL | Status: DC | PRN

## 2021-03-16 MED ORDER — MEPERIDINE HCL 25 MG/ML IJ SOLN: 6.2500 mg | INTRAMUSCULAR | Status: DC | PRN

## 2021-03-16 MED ORDER — GABAPENTIN 300 MG PO CAPS: 300.0000 mg | ORAL_CAPSULE | Freq: Three times a day (TID) | ORAL | 0 refills | Status: DC

## 2021-03-16 MED ORDER — DEXMEDETOMIDINE (PRECEDEX) IN NS 20 MCG/5ML (4 MCG/ML) IV SYRINGE
PREFILLED_SYRINGE | INTRAVENOUS | Status: AC
  Filled 2021-03-16: qty 5

## 2021-03-16 MED ORDER — CEFAZOLIN SODIUM-DEXTROSE 2-4 GM/100ML-% IV SOLN
2.0000 g | INTRAVENOUS | Status: AC
  Administered 2021-03-16: 2 g via INTRAVENOUS

## 2021-03-16 SURGICAL SUPPLY — 67 items
APL PRP STRL LF DISP 70% ISPRP (MISCELLANEOUS) ×1
BANDAGE ESMARK 6X9 LF (GAUZE/BANDAGES/DRESSINGS) ×1 IMPLANT
BIT DRILL CANNULAT MICA 2.2X60 (BIT) ×1 IMPLANT
BLADE AVERAGE 25X9 (BLADE) IMPLANT
BLADE MINI RND TIP GREEN BEAV (BLADE) ×2 IMPLANT
BLADE OSC/SAG .038X5.5 CUT EDG (BLADE) IMPLANT
BLADE SURG 15 STRL LF DISP TIS (BLADE) ×1 IMPLANT
BLADE SURG 15 STRL SS (BLADE) ×2
BNDG CMPR 9X6 STRL LF SNTH (GAUZE/BANDAGES/DRESSINGS) ×1
BNDG COHESIVE 4X5 TAN STRL (GAUZE/BANDAGES/DRESSINGS) ×1 IMPLANT
BNDG CONFORM 2 STRL LF (GAUZE/BANDAGES/DRESSINGS) ×1 IMPLANT
BNDG ELASTIC 4X5.8 VLCR STR LF (GAUZE/BANDAGES/DRESSINGS) ×2 IMPLANT
BNDG ESMARK 6X9 LF (GAUZE/BANDAGES/DRESSINGS) ×2
BNDG GAUZE ELAST 4 BULKY (GAUZE/BANDAGES/DRESSINGS) ×2 IMPLANT
CHLORAPREP W/TINT 26 (MISCELLANEOUS) ×2 IMPLANT
COVER BACK TABLE 60X90IN (DRAPES) ×2 IMPLANT
COVER WAND RF STERILE (DRAPES) ×2 IMPLANT
CUFF TOURN SGL QUICK 18X4 (TOURNIQUET CUFF) IMPLANT
DECANTER SPIKE VIAL GLASS SM (MISCELLANEOUS) IMPLANT
DRAPE 3/4 80X56 (DRAPES) ×2 IMPLANT
DRAPE C-ARM 35X43 STRL (DRAPES) ×2 IMPLANT
DRAPE EXTREMITY T 121X128X90 (DISPOSABLE) ×2 IMPLANT
DRAPE OEC MINIVIEW 54X84 (DRAPES) ×2 IMPLANT
DRAPE U-SHAPE 47X51 STRL (DRAPES) ×2 IMPLANT
DRIVER MICA HEX 2.0 (MISCELLANEOUS) ×1 IMPLANT
ELECT REM PT RETURN 9FT ADLT (ELECTROSURGICAL) ×2
ELECTRODE REM PT RTRN 9FT ADLT (ELECTROSURGICAL) ×1 IMPLANT
GAUZE SPONGE 4X4 12PLY STRL (GAUZE/BANDAGES/DRESSINGS) ×2 IMPLANT
GAUZE XEROFORM 1X8 LF (GAUZE/BANDAGES/DRESSINGS) ×2 IMPLANT
GLOVE SURG ENC MOIS LTX SZ7 (GLOVE) ×2 IMPLANT
GLOVE SURG UNDER POLY LF SZ7.5 (GLOVE) ×2 IMPLANT
GOWN STRL REUS W/TWL LRG LVL3 (GOWN DISPOSABLE) ×2 IMPLANT
HANDLE AO CANNULATED DRIVER (MISCELLANEOUS) ×1 IMPLANT
K-WIRE BLUNT TROCAR 1.4X150 (WIRE) ×2
K-WIRE BLUNT/TROCAR 0.9X150 (WIRE) ×4
K-WIRE CAPS STERILE WHITE .045 (WIRE) ×2 IMPLANT
K-WIRE DBL END TROCAR 6X.045 (WIRE) ×2
K-WIRE DBL TROCAR .045X4 (WIRE) ×2
KIT TURNOVER CYSTO (KITS) ×2 IMPLANT
KWIRE ×1 IMPLANT
KWIRE BLUNT TROCAR 1.4X150 (WIRE) IMPLANT
KWIRE BLUNT/TROCAR 0.9X150 (WIRE) IMPLANT
KWIRE DBL END TROCAR 6X.045 (WIRE) ×1 IMPLANT
KWIRE DBL TROCAR .045X4 (WIRE) ×1 IMPLANT
NDL HYPO 25X1 1.5 SAFETY (NEEDLE) IMPLANT
NDL SAFETY ECLIPSE 18X1.5 (NEEDLE) IMPLANT
NEEDLE HYPO 18GX1.5 SHARP (NEEDLE) ×2
NEEDLE HYPO 25X1 1.5 SAFETY (NEEDLE) ×2 IMPLANT
NS IRRIG 1000ML POUR BTL (IV SOLUTION) ×2 IMPLANT
PACK BASIN DAY SURGERY FS (CUSTOM PROCEDURE TRAY) ×2 IMPLANT
PENCIL SMOKE EVACUATOR (MISCELLANEOUS) ×2 IMPLANT
SCREW MICA PROSTEP 3.0X26 (Screw) ×1 IMPLANT
SCREW MICA PROSTEP 3.0X32 (Screw) ×1 IMPLANT
STAPLE FUSEFORCE 10X10 (Staple) ×1 IMPLANT
SUCTION FRAZIER HANDLE 10FR (MISCELLANEOUS) ×1
SUCTION TUBE FRAZIER 10FR DISP (MISCELLANEOUS) ×1 IMPLANT
SUT ETHILON 3 0 PS 1 (SUTURE) ×1 IMPLANT
SUT ETHILON 4 0 PS 2 18 (SUTURE) ×1 IMPLANT
SUT MNCRL AB 3-0 PS2 18 (SUTURE) ×1 IMPLANT
SUT MNCRL AB 4-0 PS2 18 (SUTURE) IMPLANT
SUT VIC AB 2-0 SH 27 (SUTURE)
SUT VIC AB 2-0 SH 27XBRD (SUTURE) IMPLANT
SYR 20ML LL LF (SYRINGE) ×1 IMPLANT
SYR BULB EAR ULCER 3OZ GRN STR (SYRINGE) ×2 IMPLANT
TRAY DSU PREP LF (CUSTOM PROCEDURE TRAY) IMPLANT
TUBE CONNECTING 12X1/4 (SUCTIONS) ×2 IMPLANT
UNDERPAD 30X36 HEAVY ABSORB (UNDERPADS AND DIAPERS) ×2 IMPLANT

## 2021-03-16 NOTE — Anesthesia Preprocedure Evaluation (Addendum)
Anesthesia Evaluation  Patient identified by MRN, date of birth, ID band Patient awake    Reviewed: Allergy & Precautions, NPO status , Patient's Chart, lab work & pertinent test results  Airway Mallampati: II  TM Distance: >3 FB Neck ROM: Full    Dental no notable dental hx. (+) Dental Advisory Given, Teeth Intact   Pulmonary neg pulmonary ROS,    Pulmonary exam normal breath sounds clear to auscultation       Cardiovascular negative cardio ROS Normal cardiovascular exam Rhythm:Regular Rate:Normal     Neuro/Psych negative neurological ROS     GI/Hepatic negative GI ROS, Neg liver ROS,   Endo/Other  negative endocrine ROS  Renal/GU negative Renal ROS     Musculoskeletal negative musculoskeletal ROS (+)   Abdominal   Peds  Hematology negative hematology ROS (+)   Anesthesia Other Findings   Reproductive/Obstetrics                            Anesthesia Physical Anesthesia Plan  ASA: II  Anesthesia Plan: MAC   Post-op Pain Management:    Induction: Intravenous  PONV Risk Score and Plan: Treatment may vary due to age or medical condition, Ondansetron, Dexamethasone and Midazolam  Airway Management Planned:   Additional Equipment: None  Intra-op Plan:   Post-operative Plan:   Informed Consent: I have reviewed the patients History and Physical, chart, labs and discussed the procedure including the risks, benefits and alternatives for the proposed anesthesia with the patient or authorized representative who has indicated his/her understanding and acceptance.     Dental advisory given  Plan Discussed with: CRNA  Anesthesia Plan Comments:        Anesthesia Quick Evaluation

## 2021-03-16 NOTE — Anesthesia Procedure Notes (Signed)
Procedure Name: MAC Performed by: Mechele Claude, CRNA Pre-anesthesia Checklist: Patient identified, Emergency Drugs available, Suction available and Patient being monitored Oxygen Delivery Method: Simple face mask Placement Confirmation: positive ETCO2 and CO2 detector

## 2021-03-16 NOTE — Anesthesia Postprocedure Evaluation (Addendum)
Anesthesia Post Note  Patient: Patricia Valencia  Procedure(s) Performed: HAMMER TOE CORRECTION SECOND TOE RIGHT,AND HALLUX MALLEUS RIGHT  FOOT (Right Toe)     Patient location during evaluation: PACU Anesthesia Type: MAC Level of consciousness: awake and alert Pain management: pain level controlled Vital Signs Assessment: post-procedure vital signs reviewed and stable Respiratory status: spontaneous breathing Cardiovascular status: stable Anesthetic complications: no   No complications documented.  Last Vitals:  Vitals:   03/16/21 1400 03/16/21 1407  BP: (!) 138/106   Pulse: 77 78  Resp: 13 (!) 22  Temp:    SpO2: 99% 100%    Last Pain:  Vitals:   03/16/21 1407  TempSrc:   PainSc: 0-No pain                 Nolon Nations

## 2021-03-16 NOTE — H&P (Signed)
History and Physical Interval Note:  03/16/2021 11:05 AM  Patricia Valencia  has presented today for surgery, with the diagnosis of hallux malleus and hammertoe rigth foot.  The various methods of treatment have been discussed with the patient and family. After consideration of risks, benefits and other options for treatment, the patient has consented to   Procedure(s): HAMMER TOE CORRECTION SECOND TOE RIGHT,AND Akin osteotomy  RIGHT  FOOT (Right) as a surgical intervention.  The patient's history has been reviewed, patient examined, no change in status, stable for surgery.  I have reviewed the patient's chart and labs.  Questions were answered to the patient's satisfaction.     Criselda Peaches

## 2021-03-16 NOTE — Discharge Instructions (Signed)
  Post Anesthesia Home Care Instructions  Activity: Get plenty of rest for the remainder of the day. A responsible adult should stay with you for 24 hours following the procedure.  For the next 24 hours, DO NOT: -Drive a car -Paediatric nurse -Drink alcoholic beverages -Take any medication unless instructed by your physician -Make any legal decisions or sign important papers.  Meals: Start with liquid foods such as gelatin or soup. Progress to regular foods as tolerated. Avoid greasy, spicy, heavy foods. If nausea and/or vomiting occur, drink only clear liquids until the nausea and/or vomiting subsides. Call your physician if vomiting continues.  Special Instructions/Symptoms: Your throat may feel dry or sore from the anesthesia or the breathing tube placed in your throat during surgery. If this causes discomfort, gargle with warm salt water. The discomfort should disappear within 24 hours.  If you had a scopolamine patch placed behind your ear for the management of post- operative nausea and/or vomiting:  1. The medication in the patch is effective for 72 hours, after which it should be removed.  Wrap patch in a tissue and discard in the trash. Wash hands thoroughly with soap and water. 2. You may remove the patch earlier than 72 hours if you experience unpleasant side effects which may include dry mouth, dizziness or visual disturbances. 3. Avoid touching the patch. Wash your hands with soap and water after contact with the patch.   Post-Surgery Instructions  1. If you are recuperating from surgery anywhere other than home, please be sure to leave [place] a number where you can be reached. 2. Go directly home and rest. 3. The keep operated foot (or feet) elevated six inches above the hip when sitting or lying down. 4. Support the elevated foot and leg with pillows under the calf. DO NOT PLACE PILLOWS UNDER THE KNEE. 5. DO NOT REMOVE or get your bandages wet. This will increase your  chances of getting an infection. 6. Wear your surgical shoe at all times when you are up. 7. A limited amount of pain and swelling may occur. The skin may take on a bruised appearance. This is no cause for alarm. 8. For slight pain and swelling, apply an ice pack directly over the bandage for 15 minutes every hour. Continue icing until seen in the office. DO NOT apply any form of heat to the area. 9. Have prescription(s) filled immediately and take as directed. 10. Drink lots of liquids, water, and juice. 11. CALL THE OFFICE IMMEDIATELY IF: a. Bleeding continues b. Pain increases and/or does not respond to medication c. Bandage or cast appears too tight d. Any liquids (water, coffee, etc.) have spilled on your bandages. e. Tripping, falling, or stubbing the surgical foot f. If your temperature rises above 101 g. If you have ANY questions at all 12. Please use the crutches, knee scooter, or walker you have prescribed, rented, or purchased. If you are non-weight bearing DO NOT put weight on the operated foot for _________ days. If you are weight-bearing, follow your physician's instructions. You are expected to be: X weight-bearing ? non-weight bearing 13. Special Instructions: _____________________________________________________________ _________________________________________________________________________________ _________________________________________________________________________________  14. Your next appointment is: 03/20/2021 9:15 AM  If you need to reach the nurse for any reason, please call: [place]: [phone] [place]: [phone] [place]: [phone]

## 2021-03-16 NOTE — H&P (Signed)
Anesthesia H&P Update: History and Physical Exam reviewed; patient is OK for planned anesthetic and procedure. ? ?

## 2021-03-16 NOTE — Transfer of Care (Signed)
Immediate Anesthesia Transfer of Care Note  Patient: Patricia Valencia  Procedure(s) Performed: HAMMER TOE CORRECTION SECOND TOE RIGHT,AND HALLUX MALLEUS RIGHT  FOOT (Right Toe)  Patient Location: PACU  Anesthesia Type:MAC  Level of Consciousness: awake, alert  and oriented  Airway & Oxygen Therapy: Patient Spontanous Breathing and Patient connected to face mask oxygen  Post-op Assessment: Report given to RN and Post -op Vital signs reviewed and stable  Post vital signs: Reviewed and stable  Last Vitals:  Vitals Value Taken Time  BP 114/72   Temp    Pulse 90 03/16/21 1313  Resp 13 03/16/21 1313  SpO2 100 % 03/16/21 1313  Vitals shown include unvalidated device data.  Last Pain:  Vitals:   03/16/21 0828  TempSrc: Oral  PainSc: 0-No pain      Patients Stated Pain Goal: 6 (25/67/20 9198)  Complications: No complications documented.

## 2021-03-16 NOTE — Brief Op Note (Signed)
03/16/2021  1:16 PM  PATIENT:  Patricia Valencia  35 y.o. female  PRE-OPERATIVE DIAGNOSIS:  [place] DIAGNOSIS:  HALLUX [place]:  Procedure(s): HAMMER TOE CORRECTION SECOND TOE RIGHT,AND HALLUX MALLEUS RIGHT  FOOT (Right)  SURGEON:  Surgeon(s) and Role:    * Baila Rouse, Stephan Minister, DPM - Primary  PHYSICIAN ASSISTANT:   ASSISTANTS: none   ANESTHESIA:   MAC  EBL:  5 mL   BLOOD ADMINISTERED:none  DRAINS: none   LOCAL MEDICATIONS USED:  MARCAINE   , LIDOCAINE  and Amount: 7.5 ml each  SPECIMEN:  No Specimen  DISPOSITION OF SPECIMEN:  N/A  COUNTS:  YES  TOURNIQUET:   Total Tourniquet Time Documented: area (laterality) - 70 minutes Total: area (laterality) - 70 minutes   DICTATION: .Note written in EPIC  PLAN OF CARE: Discharge to home after PACU  PATIENT DISPOSITION:  PACU - hemodynamically stable.   Delay start of Pharmacological VTE agent (>24hrs) due to surgical blood loss or risk of bleeding: not applicable

## 2021-03-17 DIAGNOSIS — M2011 Hallux valgus (acquired), right foot: Secondary | ICD-10-CM

## 2021-03-17 DIAGNOSIS — M898X7 Other specified disorders of bone, ankle and foot: Secondary | ICD-10-CM

## 2021-03-17 DIAGNOSIS — M2041 Other hammer toe(s) (acquired), right foot: Secondary | ICD-10-CM

## 2021-03-17 NOTE — Op Note (Signed)
Patient Name: Patricia Valencia DOB: 27-Mar-1986  MRN: [id number]   Date of Service: 03/16/2021  Surgeon: Dr. Lanae Crumbly, DPM Assistants: None Pre-operative Diagnosis:  #1 hallux interphalangeus deformity right foot #2 exostosis of toe right foot #3 hammertoe deformity right foot second toe  Post-operative Diagnosis:  #1 hallux interphalangeus deformity right foot #2 exostosis of toe right foot #3 hammertoe deformity right foot second toe Procedures:  1) shortening and angular correction osteotomy right hallux  2) partial excision of the proximal phalanx right hallux  3) correction hammertoe right second toe Pathology/Specimens: * No specimens in log * Anesthesia: Monitored anesthesia care Hemostasis:  Total Tourniquet Time Documented: area (laterality) - 70 minutes Total: area (laterality) - 70 minutes  Estimated Blood Loss: 5 mL Materials:  Implant Name Type Inv. Item Serial No. Manufacturer Lot No. LRB No. Used Action  prostep mica screw    [employer] [id number] Right 1 Implanted and Explanted  STAPLE FUSEFORCE 10X10 - [id number] Staple STAPLE FUSEFORCE 10X10  [place] [id number] Right 1 Implanted  PROSTEP MICA SCREW    [facility] MEDICAL [id number] Right 1 Implanted  [facility] SURGICAL INSTRUMENTS [id number] Right 1 Implanted   Medications: 15 cc of 0.5% Marcaine and 2% lidocaine and 31-51 mix Complications: None  Indications for Procedure:  This is a 35 y.o. female with a history of painful hallux interphalangeus and hallux malleus deformity with a second hammertoe and dorsal PIPJ corn.  She lifted for operative intervention after failing nonsurgical therapy.  All risk, benefits and potential complications were discussed prior to surgery.  Informed consent was signed and reviewed in the office.   Procedure in Detail: Patient was identified in pre-operative holding area. Formal consent was signed and the right lower extremity was marked. Patient was brought  back to the operating room. Anesthesia was induced. The extremity was prepped and draped in the usual sterile fashion. Timeout was taken to confirm patient name, laterality, and procedure prior to incision.   Attention was then directed to the right hallux where an incision was made over the proximal phalanx just medial to the extensor hallucis longus tendon.  This was carried deep through subcutaneous tissues ensuring that all vital neurovascular structures were protected throughout the procedure.  Bleeders were cauterized as necessary.  The deep fascia was incised and dissection was carried down to the level of the bone and the periosteum reflected.  An oblique Akin osteotomy with a distal medial based wedge was then taken out of the proximal phalanx of the hallux.  After completion of the osteotomy and reduction it still maintained prominence bone over the head of the distal phalanx.  I determined to complete the osteotomy in oblique fashion and slide the distal fragment proximally along the osteotomy plane to shorten the hallux and plantar flex it.  Position was checked with fluoroscopy.  A [employer] medical 3.0 millimeter screw was then inserted from proximal medial to distal lateral to fixate the osteotomy, he required additional fixation for rotational stability.  The sizer for the staple was then used and we determined that a size 10 mm staple would be adequate.  The staple site was then drilled and inserted per manufacturer's guidelines.  Good compression and stability of the osteotomy was noted.  I then directed my attention and dissection distally and expose the interphalangeal joint.  Prominence of the dorsal head of the proximal phalanx was still palpable through the skin.  I exposed this prominent bone and then resected  using a rongeur to correct the palpable exostosis.  It was irrigated thoroughly.  The incision was then closed using 3-0 Monocryl and 3-0 nylon.  Attention was then directed to the  second digit where a linear incision was made from the base of the proximal phalanx to the distal middle phalanx.  This was carried deep to expose the extensor digitorum longus tendon and a transverse tenotomy was made.  The PIPJ was exposed and the collateral ligaments were released.  The articular surface of the head of the proximal phalanx and the base of the middle phalanx was then resected using a sagittal saw.  A 0.045 inch Kirschner wire was then inserted through the base of the middle phalanx through the distal phalanx and out the tip of the toe just under the nail.  This was then driven retrograde to the level of the base of the proximal phalanx.  Good compression of the osteotomy site was noted clinically and fluoroscopically with adequate reduction of the deformity.  The foot was then dressed with Xeroform, 4 x 4's, Kling, Kerlix and Coban. Patient tolerated the procedure well.   Disposition: Following a period of post-operative monitoring, patient Nery Frappier be transferred to home she Asha Grumbine be WBAT in a surgical shoe.

## 2021-03-20 ENCOUNTER — Ambulatory Visit (INDEPENDENT_AMBULATORY_CARE_PROVIDER_SITE_OTHER): Payer: Medicaid Other | Admitting: Podiatry

## 2021-03-20 ENCOUNTER — Encounter: Payer: Self-pay | Admitting: Podiatry

## 2021-03-20 ENCOUNTER — Other Ambulatory Visit: Payer: Self-pay

## 2021-03-20 ENCOUNTER — Ambulatory Visit (INDEPENDENT_AMBULATORY_CARE_PROVIDER_SITE_OTHER): Payer: Medicaid Other

## 2021-03-20 DIAGNOSIS — M2041 Other hammer toe(s) (acquired), right foot: Secondary | ICD-10-CM

## 2021-03-20 DIAGNOSIS — Z9889 Other specified postprocedural states: Secondary | ICD-10-CM

## 2021-03-20 DIAGNOSIS — M2031 Hallux varus (acquired), right foot: Secondary | ICD-10-CM

## 2021-03-20 NOTE — Progress Notes (Signed)
  Subjective:  Patient ID: Patricia Valencia, female    DOB: 08/11/1986,  MRN: [id number]  Chief Complaint  Patient presents with  . Routine Post Op    PT stated that she is doing okay she has had some pain but nothing to major \ Denies any fever chills nausea and or vomitting    DOS: 03/16/2021 Procedure: Right foot hallux phalangeal osteotomy, second hammertoe correction  35 y.o. female returns for post-op check.  Doing well  Review of Systems: Negative except as noted in the HPI. Denies N/V/F/Ch.   Objective:  There were no vitals filed for this visit. There is no height or weight on file to calculate BMI. Constitutional Well developed. Well nourished.  Vascular Foot warm and well perfused. Capillary refill normal to all digits.   Neurologic Normal speech. Oriented to person, place, and time. Epicritic sensation to light touch grossly present bilaterally.  Dermatologic Skin healing well without signs of infection. Skin edges well coapted without signs of infection.  Orthopedic: Tenderness to palpation noted about the surgical site.   Radiographs: Hardware intact and correction of osteotomy in maintained position from immediate postop films Assessment:   1. Status post surgery   2. Hammertoe of right foot   3. Hallux malleus, right    Plan:  Patient was evaluated and treated and all questions answered.  S/p foot surgery right -Progressing as expected post-operatively. -XR: As above -WB Status: WBAT in surgical shoe -Sutures: We will leave intact for 1 week. -Medications: No refills required.  We previously discussed terbinafine use for nail fungus, plan to wait until she is no longer taking significant outside Tylenol for this -Foot redressed.  No follow-ups on file.

## 2021-03-22 ENCOUNTER — Telehealth: Payer: Self-pay | Admitting: Podiatry

## 2021-03-22 NOTE — Telephone Encounter (Signed)
Pt called and stated that her dressing on her sx foot is unraveling. I offered her an appointment today with the nurse and she was unable to come at 1:30.

## 2021-03-29 ENCOUNTER — Other Ambulatory Visit: Payer: Self-pay

## 2021-03-29 ENCOUNTER — Ambulatory Visit (INDEPENDENT_AMBULATORY_CARE_PROVIDER_SITE_OTHER): Payer: Medicaid Other | Admitting: Podiatry

## 2021-03-29 DIAGNOSIS — M2031 Hallux varus (acquired), right foot: Secondary | ICD-10-CM

## 2021-03-29 DIAGNOSIS — Z9889 Other specified postprocedural states: Secondary | ICD-10-CM

## 2021-03-29 DIAGNOSIS — M2041 Other hammer toe(s) (acquired), right foot: Secondary | ICD-10-CM

## 2021-03-29 NOTE — Progress Notes (Signed)
  Subjective:  Patient ID: Patricia Valencia, female    DOB: 1986-05-06,  MRN: [id number]  Chief Complaint  Patient presents with  . Routine Post Op    POV #2 DOS 03/16/2021 CORRECTION HAMMERTOE 2ND TOE & HALLUX MALLUS W/BONE CUT & TRENDON LENGTHENING RT FOOT    DOS: 03/16/2021 Procedure: Right foot hallux phalangeal osteotomy, second hammertoe correction  35 y.o. female returns for post-op check.  Doing well.  She presents without her surgical shoe today.  Says her dressing came off  Review of Systems: Negative except as noted in the HPI. Denies N/V/F/Ch.   Objective:  There were no vitals filed for this visit. There is no height or weight on file to calculate BMI. Constitutional Well developed. Well nourished.  Vascular Foot warm and well perfused. Capillary refill normal to all digits.   Neurologic Normal speech. Oriented to person, place, and time. Epicritic sensation to light touch grossly present bilaterally.  Dermatologic Skin healing well without signs of infection. Skin edges well coapted without signs of infection..  Sutures intact  Orthopedic: Tenderness to palpation noted about the surgical site.   Radiographs: Hardware intact and correction of osteotomy in maintained position from immediate postop films Assessment:   1. Status post surgery   2. Hallux malleus, right   3. Hammertoe of right foot    Plan:  Patient was evaluated and treated and all questions answered.  S/p foot surgery right -Progressing as expected post-operatively. -XR: As above -WB Status: WBAT in surgical shoe, emphasize importance of being in the surgical shoe for stability of the correction and she says she will wear this -Sutures: Removed she may begin bathing, apply Neosporin on the pin site  Return in about 3 weeks (around 04/19/2021) for take new x-rays and pull pin.

## 2021-04-12 ENCOUNTER — Encounter: Payer: Medicaid Other | Admitting: Podiatry

## 2021-04-15 ENCOUNTER — Encounter: Payer: Self-pay | Admitting: Podiatry

## 2021-04-15 NOTE — Progress Notes (Signed)
Patient called on call - stated that her pin got caught on sheets and came almost completely out. She states that she pulled the rest of it out for safety. I advised that this was the correct thing to do and advised her to protect the area and wear her shoe so that the healing bones do not get too much pressure. She verbalized understanding. She will be seen at her scheduled post op appt this week.

## 2021-04-19 ENCOUNTER — Other Ambulatory Visit: Payer: Self-pay

## 2021-04-19 ENCOUNTER — Ambulatory Visit: Payer: Medicaid Other

## 2021-04-19 ENCOUNTER — Ambulatory Visit (INDEPENDENT_AMBULATORY_CARE_PROVIDER_SITE_OTHER): Payer: Medicaid Other | Admitting: Podiatry

## 2021-04-19 ENCOUNTER — Encounter: Payer: Self-pay | Admitting: Podiatry

## 2021-04-19 DIAGNOSIS — M2031 Hallux varus (acquired), right foot: Secondary | ICD-10-CM

## 2021-04-19 DIAGNOSIS — M2041 Other hammer toe(s) (acquired), right foot: Secondary | ICD-10-CM

## 2021-04-19 DIAGNOSIS — Z9889 Other specified postprocedural states: Secondary | ICD-10-CM

## 2021-04-19 NOTE — Progress Notes (Signed)
  Subjective:  Patient ID: Patricia Valencia, female    DOB: 06/21/1986,  MRN: [id number]  Chief Complaint  Patient presents with  . Routine Post Op    Right foot post op.  PT stated that she is doing well she has no concerns     DOS: 03/16/2021 Procedure: Right foot hallux phalangeal osteotomy, second hammertoe correction  35 y.o. female returns for post-op check.  Doing well.  She has little to no pain  Review of Systems: Negative except as noted in the HPI. Denies N/V/F/Ch.   Objective:  There were no vitals filed for this visit. There is no height or weight on file to calculate BMI. Constitutional Well developed. Well nourished.  Vascular Foot warm and well perfused. Capillary refill normal to all digits.   Neurologic Normal speech. Oriented to person, place, and time. Epicritic sensation to light touch grossly present bilaterally.  Dermatologic  surgical incision well-healed  Orthopedic:  Today she has tenderness to palpation noted about the surgical site.   Radiographs: Maintained hardware alignment, toe arthrodesis is bridging, Kirschner wire previously removed Assessment:   1. Hallux malleus, right   2. Status post surgery   3. Hammertoe of right foot    Plan:  Patient was evaluated and treated and all questions answered.  S/p foot surgery right -Progressing as expected post-operatively. -XR: As above -WB Status: WBAT in regular supportive shoe gear avoid impactful exercise or activity for the next 2 months -We will plan for surgery on the left foot June 17 procedure, will sign consent at next visit Return in about 5 weeks (around 05/24/2021) for post op right foot, plan left foot surgery .

## 2021-05-24 ENCOUNTER — Other Ambulatory Visit: Payer: Self-pay

## 2021-05-24 ENCOUNTER — Ambulatory Visit (INDEPENDENT_AMBULATORY_CARE_PROVIDER_SITE_OTHER): Payer: Medicaid Other | Admitting: Podiatry

## 2021-05-24 DIAGNOSIS — M2032 Hallux varus (acquired), left foot: Secondary | ICD-10-CM

## 2021-05-24 DIAGNOSIS — M2042 Other hammer toe(s) (acquired), left foot: Secondary | ICD-10-CM | POA: Diagnosis not present

## 2021-05-28 NOTE — Progress Notes (Signed)
  Subjective:  Patient ID: Patricia Valencia, female    DOB: 1986-11-07,  MRN: [id number]  Chief Complaint  Patient presents with  . Routine Post Op    S/P right foot surgery - doing great - discuss/plan left foot surgery    DOS: 03/16/2021 Procedure: Right foot hallux phalangeal osteotomy, second hammertoe correction  35 y.o. female returns for post-op check.  Concerned about scarring right foot.  Surgery is planned for the left foot  Review of Systems: Negative except as noted in the HPI. Denies N/V/F/Ch.   Objective:  There were no vitals filed for this visit. There is no height or weight on file to calculate BMI. Constitutional Well developed. Well nourished.  Vascular Foot warm and well perfused. Capillary refill normal to all digits.   Neurologic Normal speech. Oriented to person, place, and time. Epicritic sensation to light touch grossly present bilaterally.  Dermatologic  surgical incision well-healed, it is slightly hypertrophic  Orthopedic:  No pain on right foot.  Left foot with hallux interphalangeus and second hammertoe deformity   Radiographs: Maintained hardware alignment, toe arthrodesis is bridging, Kirschner wire previously removed Assessment:   No diagnosis found. Plan:  Patient was evaluated and treated and all questions answered.  S/p foot surgery right -Recommend silicone gel on the incision on the right foot -For the left foot we will plan to do minimal observable suture and Prolene -Informed consent was signed and reviewed for the left foot procedure.  Shortening osteotomy of proximal phalanx and second hammertoe correction.  We discussed the risk benefits symptoms complications including but not limited to pain, swelling, infection, scar, numbness which may be temporary or permanent, chronic pain, stiffness, nerve pain or damage, wound healing problems, bone healing problems including delayed or non-union -All questions were addressed -Surgery  scheduled for June 17  No follow-ups on file.

## 2021-06-04 ENCOUNTER — Encounter (HOSPITAL_BASED_OUTPATIENT_CLINIC_OR_DEPARTMENT_OTHER): Payer: Self-pay | Admitting: Podiatry

## 2021-06-04 ENCOUNTER — Other Ambulatory Visit: Payer: Self-pay

## 2021-06-04 NOTE — Progress Notes (Addendum)
Spoke w/ via phone for pre-op interview--- Pt Lab needs dos----  Urine preg  (per anes)/ pre-op orders pending           Lab results------ no COVID test -----patient states asymptomatic no test needed Arrive at ------- 1030 on 06-08-2021 NPO after MN NO Solid Food.  Clear liquids from MN until--- 0930 Med rec completed Medications to take morning of surgery ----- NONE Diabetic medication ----- Patient instructed no nail polish to be worn day of surgery Patient instructed to bring photo id and insurance card day of surgery Patient aware to have Driver (ride ) / caregiver for 24 hours after surgery -- pt stated one of her sister will be picking up and be caregiver, to have name/ number when check in [facility] Patient Special Instructions ----- n/a Pre-Op special Istructions ----- received pt's pcp, Dr Sandi Mariscal, H&P dated 05-22-2021 via fax from Dr Sherryle Lis office, place in chart.  sent inbox message in epic to dr Sherryle Lis requested orders Patient verbalized understanding of instructions that were given at this phone interview. Patient denies shortness of breath, chest pain, fever, cough at this phone interview.

## 2021-06-08 ENCOUNTER — Ambulatory Visit (HOSPITAL_BASED_OUTPATIENT_CLINIC_OR_DEPARTMENT_OTHER)
Admission: RE | Admit: 2021-06-08 | Discharge: 2021-06-08 | Disposition: A | Discharge status: Home / Self Care | Payer: Medicaid Other | Source: Ambulatory Visit | Attending: Podiatry | Admitting: Podiatry

## 2021-06-08 ENCOUNTER — Encounter (HOSPITAL_BASED_OUTPATIENT_CLINIC_OR_DEPARTMENT_OTHER)
Admission: RE | Disposition: A | Discharge status: Home / Self Care | Payer: Self-pay | Source: Ambulatory Visit | Attending: Podiatry

## 2021-06-08 DIAGNOSIS — Z538 Procedure and treatment not carried out for other reasons: Secondary | ICD-10-CM | POA: Diagnosis not present

## 2021-06-08 DIAGNOSIS — Z01818 Encounter for other preprocedural examination: Secondary | ICD-10-CM | POA: Insufficient documentation

## 2021-06-08 LAB — POCT PREGNANCY, URINE: Preg Test, Ur: POSITIVE — AB

## 2021-06-08 SURGERY — EXCISION, PHALANX, PARTIAL
Anesthesia: Choice | Site: Toe | Laterality: Left

## 2021-06-08 SURGICAL SUPPLY — 60 items
BANDAGE ESMARK 6X9 LF (GAUZE/BANDAGES/DRESSINGS) IMPLANT
BLADE AVERAGE 25X9 (BLADE) IMPLANT
BLADE MINI RND TIP GREEN BEAV (BLADE) IMPLANT
BLADE OSC/SAG .038X5.5 CUT EDG (BLADE) IMPLANT
BLADE SURG 15 STRL LF DISP TIS (BLADE) IMPLANT
BLADE SURG 15 STRL SS (BLADE)
BNDG CONFORM 2 STRL LF (GAUZE/BANDAGES/DRESSINGS) IMPLANT
BNDG ELASTIC 3X5.8 VLCR STR LF (GAUZE/BANDAGES/DRESSINGS) IMPLANT
BNDG ELASTIC 4X5.8 VLCR STR LF (GAUZE/BANDAGES/DRESSINGS) IMPLANT
BNDG ESMARK 4X9 LF (GAUZE/BANDAGES/DRESSINGS) IMPLANT
BNDG ESMARK 6X9 LF (GAUZE/BANDAGES/DRESSINGS)
BNDG GAUZE ELAST 4 BULKY (GAUZE/BANDAGES/DRESSINGS) IMPLANT
CHLORAPREP W/TINT 26 (MISCELLANEOUS) IMPLANT
COVER BACK TABLE 60X90IN (DRAPES) IMPLANT
COVER WAND RF STERILE (DRAPES) IMPLANT
CUFF TOURN SGL QUICK 18X4 (TOURNIQUET CUFF) IMPLANT
DECANTER SPIKE VIAL GLASS SM (MISCELLANEOUS) IMPLANT
DRAPE 3/4 80X56 (DRAPES) IMPLANT
DRAPE C-ARM 35X43 STRL (DRAPES) IMPLANT
DRAPE EXTREMITY T 121X128X90 (DISPOSABLE) IMPLANT
DRAPE OEC MINIVIEW 54X84 (DRAPES) IMPLANT
DRAPE U-SHAPE 47X51 STRL (DRAPES) IMPLANT
ELECT REM PT RETURN 9FT ADLT (ELECTROSURGICAL)
ELECTRODE REM PT RTRN 9FT ADLT (ELECTROSURGICAL) IMPLANT
GAUZE 4X4 16PLY RFD (DISPOSABLE) IMPLANT
GAUZE SPONGE 4X4 12PLY STRL (GAUZE/BANDAGES/DRESSINGS) IMPLANT
GAUZE XEROFORM 1X8 LF (GAUZE/BANDAGES/DRESSINGS) IMPLANT
GLOVE SURG ENC MOIS LTX SZ7 (GLOVE) IMPLANT
GLOVE SURG UNDER POLY LF SZ7.5 (GLOVE) IMPLANT
GOWN STRL REUS W/TWL LRG LVL3 (GOWN DISPOSABLE) IMPLANT
K-WIRE CAPS STERILE WHITE .045 (WIRE) IMPLANT
K-WIRE DBL END TROCAR 6X.045 (WIRE)
K-WIRE DBL TROCAR .045X4 (WIRE)
KIT TURNOVER CYSTO (KITS) IMPLANT
KWIRE DBL END TROCAR 6X.045 (WIRE) IMPLANT
KWIRE DBL TROCAR .045X4 (WIRE) IMPLANT
NDL SAFETY ECLIPSE 18X1.5 (NEEDLE) IMPLANT
NEEDLE HYPO 18GX1.5 SHARP (NEEDLE)
NEEDLE HYPO 25X1 1.5 SAFETY (NEEDLE) IMPLANT
NS IRRIG 1000ML POUR BTL (IV SOLUTION) IMPLANT
PACK BASIN DAY SURGERY FS (CUSTOM PROCEDURE TRAY) IMPLANT
PADDING CAST ABS 4INX4YD NS (CAST SUPPLIES)
PADDING CAST ABS COTTON 4X4 ST (CAST SUPPLIES) IMPLANT
PENCIL SMOKE EVACUATOR (MISCELLANEOUS) IMPLANT
STOCKINETTE 6  STRL (DRAPES)
STOCKINETTE 6 STRL (DRAPES) IMPLANT
SUCTION FRAZIER HANDLE 10FR (MISCELLANEOUS)
SUCTION TUBE FRAZIER 10FR DISP (MISCELLANEOUS) IMPLANT
SUT ETHILON 4 0 PS 2 18 (SUTURE) IMPLANT
SUT MNCRL AB 3-0 PS2 18 (SUTURE) IMPLANT
SUT MNCRL AB 4-0 PS2 18 (SUTURE) IMPLANT
SUT MON AB 5-0 PS2 18 (SUTURE) IMPLANT
SUT VIC AB 2-0 SH 27 (SUTURE)
SUT VIC AB 2-0 SH 27XBRD (SUTURE) IMPLANT
SYR 20ML LL LF (SYRINGE) IMPLANT
SYR BULB EAR ULCER 3OZ GRN STR (SYRINGE) IMPLANT
TOWEL OR 17X26 10 PK STRL BLUE (TOWEL DISPOSABLE) IMPLANT
TRAY DSU PREP LF (CUSTOM PROCEDURE TRAY) IMPLANT
TUBE CONNECTING 12X1/4 (SUCTIONS) IMPLANT
UNDERPAD 30X36 HEAVY ABSORB (UNDERPADS AND DIAPERS) IMPLANT

## 2021-06-08 NOTE — Progress Notes (Signed)
Pt had a positive pregnancy test in pre op.  MD made aware and patient canceled.

## 2021-06-14 ENCOUNTER — Encounter: Payer: Medicaid Other | Admitting: Podiatry

## 2021-06-28 ENCOUNTER — Encounter: Payer: Medicaid Other | Admitting: Podiatry

## 2021-07-19 ENCOUNTER — Encounter (HOSPITAL_COMMUNITY): Payer: Self-pay | Admitting: Emergency Medicine

## 2021-07-19 ENCOUNTER — Other Ambulatory Visit: Payer: Self-pay

## 2021-07-19 ENCOUNTER — Emergency Department (HOSPITAL_COMMUNITY)
Admission: EM | Admit: 2021-07-19 | Discharge: 2021-07-19 | Disposition: A | Discharge status: Home / Self Care | Payer: Medicaid Other | Attending: Emergency Medicine | Admitting: Emergency Medicine

## 2021-07-19 ENCOUNTER — Emergency Department (HOSPITAL_COMMUNITY): Payer: Medicaid Other

## 2021-07-19 ENCOUNTER — Encounter: Payer: Medicaid Other | Admitting: Podiatry

## 2021-07-19 DIAGNOSIS — N9489 Other specified conditions associated with female genital organs and menstrual cycle: Secondary | ICD-10-CM | POA: Diagnosis not present

## 2021-07-19 DIAGNOSIS — R519 Headache, unspecified: Secondary | ICD-10-CM | POA: Insufficient documentation

## 2021-07-19 DIAGNOSIS — H53149 Visual discomfort, unspecified: Secondary | ICD-10-CM | POA: Insufficient documentation

## 2021-07-19 DIAGNOSIS — H538 Other visual disturbances: Secondary | ICD-10-CM | POA: Diagnosis not present

## 2021-07-19 DIAGNOSIS — R110 Nausea: Secondary | ICD-10-CM | POA: Diagnosis not present

## 2021-07-19 DIAGNOSIS — Z8616 Personal history of COVID-19: Secondary | ICD-10-CM | POA: Insufficient documentation

## 2021-07-19 LAB — I-STAT BETA HCG BLOOD, ED (MC, WL, AP ONLY): I-stat hCG, quantitative: <5 m[IU]/mL (ref <5)

## 2021-07-19 LAB — I-STAT CHEM 8, ED
BUN: 15 mg/dL (ref 6–20)
Calcium, Ion: 1.23 mmol/L (ref 1.15–1.40)
Chloride: 106 mmol/L (ref 98–111)
Creatinine, Ser: 0.7 mg/dL (ref 0.44–1.00)
Glucose, Bld: 105 mg/dL — ABNORMAL HIGH (ref 70–99)
HCT: 37 % (ref 36.0–46.0)
Hemoglobin: 12.6 g/dL (ref 12.0–15.0)
Potassium: 3.7 mmol/L (ref 3.5–5.1)
Sodium: 139 mmol/L (ref 135–145)
TCO2: 26 mmol/L (ref 22–32)

## 2021-07-19 MED ORDER — KETOROLAC TROMETHAMINE 30 MG/ML IJ SOLN
30.0000 mg | Freq: Once | INTRAMUSCULAR | Status: AC
  Administered 2021-07-19: 30 mg via INTRAVENOUS
  Filled 2021-07-19: qty 1

## 2021-07-19 MED ORDER — IOHEXOL 350 MG/ML SOLN
75.0000 mL | Freq: Once | INTRAVENOUS | Status: AC | PRN
  Administered 2021-07-19: 75 mL via INTRAVENOUS

## 2021-07-19 MED ORDER — METOCLOPRAMIDE HCL 5 MG/ML IJ SOLN
10.0000 mg | Freq: Once | INTRAMUSCULAR | Status: AC
  Administered 2021-07-19: 10 mg via INTRAVENOUS
  Filled 2021-07-19: qty 2

## 2021-07-19 MED ORDER — SODIUM CHLORIDE 0.9 % IV BOLUS
1000.0000 mL | Freq: Once | INTRAVENOUS | Status: AC
  Administered 2021-07-19: 1000 mL via INTRAVENOUS

## 2021-07-19 MED ORDER — DIPHENHYDRAMINE HCL 50 MG/ML IJ SOLN
12.5000 mg | Freq: Once | INTRAMUSCULAR | Status: AC
  Administered 2021-07-19: 12.5 mg via INTRAVENOUS
  Filled 2021-07-19: qty 1

## 2021-07-19 NOTE — Discharge Instructions (Signed)
You were seen in the emergency department for evaluation of headache.  Your CAT scan did not show any abnormal findings that would explain your headache.  You improved with some medications.  Please drink plenty of fluids and follow-up with your doctor.  Return to the emergency department if any worsening or concerning symptoms

## 2021-07-19 NOTE — ED Triage Notes (Signed)
Pt arrived via POV with c/o headache and blurred vision. Pt reports the headache started last Wednesday 7/20. Describes the headache as constant but sometimes increases in intensity.  Reports having amlodipine prescribed but doesn't take it as prescribed, only takes medication when she has a headache. Endorses tylenol and ibuprofen for pain control with no relief.

## 2021-07-19 NOTE — ED Provider Notes (Signed)
[facility] DEPT Provider Note   CSN: [id number] Arrival date & time: 07/19/21  0745     History Chief Complaint  Patient presents with   Headache    Patricia Valencia is a 35 y.o. female.  She has a history of hypertension but does not take daily medicine for it.  She is complaining of 1 week of of throbbing headache in the back of her head radiating to her temples.  Has some light sensitivity.  Associated with some nausea.  Works as a Marine scientist here and has been trying to control it with some Tylenol and ibuprofen and ice packs with minimal improvement.  She said she had headaches like this about 4 years ago and needed to get a migraine cocktail.  Blood pressures have been elevated.  No fevers or chills.  No nausea vomiting diarrhea.  No numbness or weakness.  Headache started gradually.  Last menstrual period was 1 week ago.  Tested for it and was negative.  The history is provided by the patient.  Headache Pain location:  Occipital, R temporal and L temporal Quality:  Dull Severity currently:  8/10 Severity at highest:  8/10 Onset quality:  Gradual Duration:  7 days Timing:  Constant Progression:  Unchanged Chronicity:  Recurrent Similar to prior headaches: yes   Relieved by:  Nothing Worsened by:  Activity and light Ineffective treatments:  Cold packs, acetaminophen, NSAIDs and resting in a darkened room Associated symptoms: blurred vision, nausea and photophobia   Associated symptoms: no abdominal pain, no cough, no diarrhea, no fever, no focal weakness, no hearing loss, no loss of balance, no neck pain, no neck stiffness and no vomiting       Past Medical History:  Diagnosis Date   History of 2019 novel coronavirus disease (COVID-19) 01/24/2021   positive test result in epic, it was done prior to surgery,  pt stated was asymptomatic   History of abnormal cervical Pap smear    History of gestational hypertension    pre-eclampsia 11/ 2021    HSV-2 infection    genital    Patient Active Problem List   Diagnosis Date Noted   Hallux interphalangeus, acquired, right    Hammertoe of right foot    Exostosis of toe    ASCUS with positive high risk HPV cervical 01/09/2021   History of preterm delivery 10/19/2020   Depressed mood 10/19/2020   Thrombocytopenia ([place]) 09/29/2020   History of pre-eclampsia 05/29/2020    Past Surgical History:  Procedure Laterality Date   CERVICAL CERCLAGE  2021   HAMMER TOE SURGERY Right 03/16/2021   Procedure: HAMMER TOE CORRECTION SECOND TOE RIGHT,AND HALLUX MALLEUS RIGHT  FOOT;  Surgeon: Criselda Peaches, DPM;  Location: [facility];  Service: Podiatry;  Laterality: Right;     OB History     Gravida  5   Para  4   Term  1   Preterm  3   AB  1   Living  4      SAB  0   IAB  1   Ectopic  0   Multiple  0   Live Births  4           Family History  Problem Relation Age of Onset   Hypertension Mother    Hypertension Father     Social History   Tobacco Use   Smoking status: Never   Smokeless tobacco: Never  Vaping Use   Vaping Use:  Never used  Substance Use Topics   Alcohol use: Not Currently    Comment: occasional   Drug use: Never    Home Medications Prior to Admission medications   Medication Sig Start Date End Date Taking? Authorizing Provider  hydrOXYzine (ATARAX/VISTARIL) 25 MG tablet Take 1 tablet (25 mg total) by mouth every 8 (eight) hours as needed for anxiety. Start with half of a tablet to assess drowsiness and functioning. Do not drive after taking. 11/10/20   Starr Lake, CNM  Multiple Vitamin (MULTIVITAMIN) tablet Take 1 tablet by mouth daily.    [provider]  Norethindrone Acetate-Ethinyl Estrad-FE (LOESTRIN 24 FE) 1-20 MG-MCG(24) tablet Take 1 tablet by mouth daily. Patient not taking: Reported on 06/04/2021 12/12/20   Chancy Milroy, MD  amLODipine (NORVASC) 5 MG tablet Take 1 tablet (5 mg total) by  mouth daily. 11/11/20 01/24/21  Aletha Halim, MD    Allergies    Sulfa antibiotics  Review of Systems   Review of Systems  Constitutional:  Negative for fever.  HENT:  Negative for hearing loss.   Eyes:  Positive for blurred vision and photophobia.  Respiratory:  Negative for cough.   Cardiovascular:  Negative for chest pain.  Gastrointestinal:  Positive for nausea. Negative for abdominal pain, diarrhea and vomiting.  Genitourinary:  Negative for dysuria.  Musculoskeletal:  Negative for neck pain and neck stiffness.  Skin:  Negative for rash.  Neurological:  Positive for headaches. Negative for focal weakness and loss of balance.   Physical Exam Updated Vital Signs BP (!) 141/100 (BP Location: Right Arm)   Pulse 82   Temp 98 F (36.7 C) (Oral)   Resp 16   Ht '5\' 4"'$  (1.626 m)   Wt 72 kg   SpO2 98%   BMI 27.25 kg/m   Physical Exam Vitals and nursing note reviewed.  Constitutional:      General: She is not in acute distress.    Appearance: Normal appearance. She is well-developed.  HENT:     Head: Normocephalic and atraumatic.  Eyes:     Conjunctiva/sclera: Conjunctivae normal.  Cardiovascular:     Rate and Rhythm: Normal rate and regular rhythm.     Heart sounds: No murmur heard. Pulmonary:     Effort: Pulmonary effort is normal. No respiratory distress.     Breath sounds: Normal breath sounds.  Abdominal:     Palpations: Abdomen is soft.     Tenderness: There is no abdominal tenderness.  Musculoskeletal:        General: Normal range of motion.     Cervical back: Neck supple.     Right lower leg: No edema.     Left lower leg: No edema.  Skin:    General: Skin is warm and dry.  Neurological:     General: No focal deficit present.     Mental Status: She is alert.     GCS: GCS eye subscore is 4. GCS verbal subscore is 5. GCS motor subscore is 6.     Cranial Nerves: No cranial nerve deficit or dysarthria.     Sensory: No sensory deficit.     Motor: No  weakness.     Gait: Gait normal.    ED Results / Procedures / Treatments   Labs (all labs ordered are listed, but only abnormal results are displayed) Labs Reviewed  I-STAT CHEM 8, ED - Abnormal; Notable for the following components:      Result Value   Glucose, Bld  105 (*)    All other components within normal limits  I-STAT BETA HCG BLOOD, ED ([facility], [facility], [facility] ONLY)    EKG None  Radiology CT VENOGRAM HEAD  Result Date: 07/19/2021 CLINICAL DATA:  Dural venous sinus thrombosis suspected. Headache. Blurred vision. EXAM: CT HEAD WITHOUT AND WITH CONTRAST TECHNIQUE: Contiguous axial images were obtained from the base of the skull through the vertex without and with intravenous contrast CONTRAST:  66m OMNIPAQUE IOHEXOL 350 MG/ML SOLN COMPARISON:  None. FINDINGS: Brain: The brain has a normal appearance without evidence malformation, atrophy, old or acute infarction, mass lesion, hemorrhage, hydrocephalus or extra-axial collection. Vascular: No abnormal vascular finding. Specifically, no evidence venous thrombosis in any location. Skull: Normal Sinuses/Orbits: Old medial orbital blowout fracture on the right. Few opacified ethmoid air cells. Some mucosal thickening of the upper right maxillary sinus. Other: None IMPRESSION: Normal appearance of the brain itself. No evidence venous thrombosis. Old medial wall orbital blowout fracture. Few scattered opacified ethmoid air cells. Mild mucosal thickening of the upper right maxillary sinus, not completely evaluated. Electronically Signed   By: MNelson ChimesM.D.   On: 07/19/2021 11:35    Procedures Procedures   Medications Ordered in ED Medications  ketorolac (TORADOL) 30 MG/ML injection 30 mg (has no administration in time range)  sodium chloride 0.9 % bolus 1,000 mL (has no administration in time range)  metoCLOPramide (REGLAN) injection 10 mg (has no administration in time range)  diphenhydrAMINE (BENADRYL) injection 12.5 mg (has no administration  in time range)    ED Course  I have reviewed the triage vital signs and the nursing notes.  Pertinent labs & imaging results that were available during my care of the patient were reviewed by me and considered in my medical decision making (see chart for details).  Clinical Course as of 07/19/21 1740  Thu Jul 19, 2021  1143 Patient's headache is improved after migraine cocktail.  Her imaging does not show any acute findings.  She is comfortable plan for discharge.  Return instructions discussed [MB]    Clinical Course User Index [MB] BHayden Rasmussen MD   MDM Rules/Calculators/A&P                          This patient complains of headache photophobia blurred vision; this involves an extensive number of treatment Options and is a complaint that carries with it a high risk of complications and Morbidity. The differential includes migraine headache, tension headache, hypertensive emergency, hyperglycemia, cerebral venous thrombosis  I ordered, reviewed and interpreted labs, which included i-STAT chemistry and pregnancy test unremarkable I ordered medication migraine cocktail with improvement in her headache I ordered imaging studies which included CT head and CT venogram and I independently    visualized and interpreted imaging which showed no acute findings  Previous records obtained and reviewed in epic, no recent admissions  After the interventions stated above, I reevaluated the patient and found her symptoms to be improved.  Blood pressure is a little up, she said she is tired from working the overnight shift and the medications.  She is comfortable plan for outpatient follow-up with her primary care doctor.  Return instructions discussed   Final Clinical Impression(s) / ED Diagnoses Final diagnoses:  Bad headache    Rx / DC Orders ED Discharge Orders     None        BHayden Rasmussen MD 07/19/21 1742

## 2022-02-04 ENCOUNTER — Ambulatory Visit: Payer: Medicaid Other | Admitting: Podiatry

## 2022-02-12 ENCOUNTER — Ambulatory Visit (INDEPENDENT_AMBULATORY_CARE_PROVIDER_SITE_OTHER): Payer: Medicaid Other | Admitting: Podiatry

## 2022-02-12 ENCOUNTER — Other Ambulatory Visit: Payer: Self-pay

## 2022-02-12 ENCOUNTER — Ambulatory Visit (INDEPENDENT_AMBULATORY_CARE_PROVIDER_SITE_OTHER): Payer: Medicaid Other

## 2022-02-12 DIAGNOSIS — M2032 Hallux varus (acquired), left foot: Secondary | ICD-10-CM | POA: Diagnosis not present

## 2022-02-12 DIAGNOSIS — M2041 Other hammer toe(s) (acquired), right foot: Secondary | ICD-10-CM

## 2022-02-12 DIAGNOSIS — M2042 Other hammer toe(s) (acquired), left foot: Secondary | ICD-10-CM

## 2022-02-12 DIAGNOSIS — M2031 Hallux varus (acquired), right foot: Secondary | ICD-10-CM

## 2022-02-18 NOTE — Progress Notes (Signed)
°  Subjective:  Patient ID: Patricia Valencia, female    DOB: May 04, 1986,  MRN: [id number]  Chief Complaint  Patient presents with   [place]    36 y.o. female presents with the above complaint. History confirmed with patient.  She returns today to have her left foot surgery rescheduled.  The right foot is doing well and she is pleased with the result.  Objective:  Physical Exam: warm, good capillary refill, no trophic changes or ulcerative lesions, normal DP and PT pulses, normal sensory exam, and hallux malleus of the left foot and second hammertoe.  Incisions on right foot are well-healed there is good correction maintained.   Radiographs: Multiple views x-ray of both feet: Hardware intact on right foot with good consolidation across the osteotomy and arthrodesis sites with good correction, hallux malleus and hammertoe deformity left foot Assessment:   1. Hallux malleus of left foot   2. Hammertoe of left foot   3. Hallux malleus, right   4. Hammertoe of right foot      Plan:  Patient was evaluated and treated and all questions answered.  Today we again discussed correction of her left foot just like we did on the right foot. For the left foot we will plan to do minimal absorbable suture and Prolene. Informed consent was signed and reviewed for the left foot procedure: Shortening osteotomy of proximal phalanx and second hammertoe correction.  We discussed the risk benefits symptoms complications including but not limited to pain, swelling, infection, scar, numbness which may be temporary or permanent, chronic pain, stiffness, nerve pain or damage, wound healing problems, bone healing problems including delayed or non-union.  All questions were addressed.  No guarantees as to the outcome of surgery were made.  She understands and wishes to proceed.  Surgical be scheduled at a mutually agreeable date.   Surgical plan:  Procedure: -Shortening Aiken osteotomy of the left hallux, second  hammertoe correction  Location: -[facility]  Anesthesia plan: -IV sedation with local  Postoperative pain plan: - Tylenol 1000 mg every 6 hours, ibuprofen 600 mg every 6 hours, gabapentin 300 mg every 8 hours x5 days, oxycodone 5 mg 1-2 tabs every 6 hours only as needed  DVT prophylaxis: -None required  WB Restrictions / DME needs: -WBAT in surgical shoe we will dispense at surgical center   No follow-ups on file.

## 2022-05-15 ENCOUNTER — Encounter (HOSPITAL_COMMUNITY): Payer: Self-pay

## 2022-05-15 ENCOUNTER — Other Ambulatory Visit: Payer: Self-pay

## 2022-05-15 ENCOUNTER — Emergency Department (HOSPITAL_COMMUNITY)
Admission: EM | Admit: 2022-05-15 | Discharge: 2022-05-15 | Disposition: A | Discharge status: Home / Self Care | Payer: Medicaid Other | Attending: Emergency Medicine | Admitting: Emergency Medicine

## 2022-05-15 DIAGNOSIS — R519 Headache, unspecified: Secondary | ICD-10-CM

## 2022-05-15 DIAGNOSIS — K0889 Other specified disorders of teeth and supporting structures: Secondary | ICD-10-CM | POA: Diagnosis present

## 2022-05-15 DIAGNOSIS — K029 Dental caries, unspecified: Secondary | ICD-10-CM | POA: Diagnosis not present

## 2022-05-15 MED ORDER — IBUPROFEN 600 MG PO TABS: 600.0000 mg | ORAL_TABLET | Freq: Three times a day (TID) | ORAL | 0 refills | Status: AC | PRN

## 2022-05-15 MED ORDER — PENICILLIN V POTASSIUM 500 MG PO TABS: 500.0000 mg | ORAL_TABLET | Freq: Three times a day (TID) | ORAL | 0 refills | Status: AC

## 2022-05-15 MED ORDER — IBUPROFEN 200 MG PO TABS
600.0000 mg | ORAL_TABLET | Freq: Once | ORAL | Status: AC
  Administered 2022-05-15: 600 mg via ORAL
  Filled 2022-05-15: qty 3

## 2022-05-15 MED ORDER — PENICILLIN V POTASSIUM 500 MG PO TABS
500.0000 mg | ORAL_TABLET | Freq: Once | ORAL | Status: AC
  Administered 2022-05-15: 500 mg via ORAL
  Filled 2022-05-15: qty 1

## 2022-05-15 NOTE — ED Provider Notes (Signed)
[facility] DEPT Provider Note   CSN: [id number] Arrival date & time: 05/15/22  0645     History  Chief Complaint  Patient presents with   Dental Pain    Patricia Valencia is a 36 y.o. female.   Dental Pain     Home Medications Prior to Admission medications   Medication Sig Start Date End Date Taking? Authorizing Provider  ibuprofen (ADVIL) 600 MG tablet Take 1 tablet (600 mg total) by mouth every 8 (eight) hours as needed. 05/15/22  Yes Dorie Rank, MD  penicillin v potassium (VEETID) 500 MG tablet Take 1 tablet (500 mg total) by mouth 3 (three) times daily. 05/15/22  Yes Dorie Rank, MD  hydrOXYzine (ATARAX/VISTARIL) 25 MG tablet Take 1 tablet (25 mg total) by mouth every 8 (eight) hours as needed for anxiety. Start with half of a tablet to assess drowsiness and functioning. Do not drive after taking. 11/10/20   Starr Lake, CNM  Multiple Vitamin (MULTIVITAMIN) tablet Take 1 tablet by mouth daily.    [provider]  Norethindrone Acetate-Ethinyl Estrad-FE (LOESTRIN 24 FE) 1-20 MG-MCG(24) tablet Take 1 tablet by mouth daily. Patient not taking: Reported on 06/04/2021 12/12/20   Chancy Milroy, MD  Marilu Favre 150-35 MCG/24HR transdermal patch SMARTSIG:Topical 06/19/21   [provider]  amLODipine (NORVASC) 5 MG tablet Take 1 tablet (5 mg total) by mouth daily. 11/11/20 01/24/21  Aletha Halim, MD      Allergies    Sulfa antibiotics    Review of Systems   Review of Systems  Physical Exam Updated Vital Signs BP (!) 159/95 (BP Location: Left Arm)   Pulse 88   Temp 98.2 F (36.8 C) (Oral)   Resp 16   SpO2 99%  Physical Exam Vitals and nursing note reviewed.  Constitutional:      General: She is not in acute distress.    Appearance: She is well-developed.  HENT:     Head: Normocephalic and atraumatic.     Comments: No facial swelling, mild ttp right maxillary region    Right Ear: Tympanic membrane and  external ear normal.     Left Ear: Tympanic membrane and external ear normal.     Ears:     Comments: Dental caries, ttp right maxillary molar region, no oral swelling noted Eyes:     General: No scleral icterus.       Right eye: No discharge.        Left eye: No discharge.     Conjunctiva/sclera: Conjunctivae normal.  Neck:     Trachea: No tracheal deviation.  Cardiovascular:     Rate and Rhythm: Normal rate.  Pulmonary:     Effort: Pulmonary effort is normal. No respiratory distress.     Breath sounds: No stridor.  Abdominal:     General: There is no distension.  Musculoskeletal:        General: No swelling or deformity.     Cervical back: Neck supple.  Skin:    General: Skin is warm and dry.     Findings: No rash.  Neurological:     Mental Status: She is alert.     Cranial Nerves: Cranial nerve deficit: no gross deficits.    ED Results / Procedures / Treatments   Labs (all labs ordered are listed, but only abnormal results are displayed) Labs Reviewed - No data to display  EKG None  Radiology No results found.  Procedures Procedures    Medications Ordered in  ED Medications  penicillin v potassium (VEETID) tablet 500 mg (has no administration in time range)  ibuprofen (ADVIL) tablet 600 mg (has no administration in time range)    ED Course/ Medical Decision Making/ A&P                           Medical Decision Making Problems Addressed: Acute nonintractable headache, unspecified headache type: acute illness or injury Pain due to dental caries: acute illness or injury  Risk OTC drugs. Prescription drug management.   No signs of ear infection.  Doubt sinusitis.  Suspect pain is related to the dental caries.  Will treat with abx, pain medications.  Close dental follow up, referral provided.        Final Clinical Impression(s) / ED Diagnoses Final diagnoses:  Pain due to dental caries    Rx / DC Orders ED Discharge Orders          Ordered     ibuprofen (ADVIL) 600 MG tablet  Every 8 hours PRN        05/15/22 0713    penicillin v potassium (VEETID) 500 MG tablet  3 times daily        05/15/22 4492              Dorie Rank, MD 05/15/22 0730

## 2022-05-15 NOTE — ED Triage Notes (Signed)
Pt complains of right upper dental pain that is causing a migraine and right ear pain. Pt states that it started a couple of days ago but is worse this morning.

## 2022-12-28 IMAGING — CT CT VENOGRAM HEAD
2 of 9 series · 16 of 47 positions shown · IV contrast (omnipaque)
Comparison: None.

CLINICAL DATA: Dural venous sinus thrombosis suspected. Headache.
Blurred vision.

EXAM:
CT HEAD WITHOUT AND WITH CONTRAST
TECHNIQUE: Contiguous axial images were obtained from the base of the skull
through the vertex without and with intravenous contrast
CONTRAST:  75mL OMNIPAQUE IOHEXOL 350 MG/ML SOLN

[Series 4: coronal st · coronal · 0.35mm/px · 1 of 69 slices shown]
[im 35/69  brain]
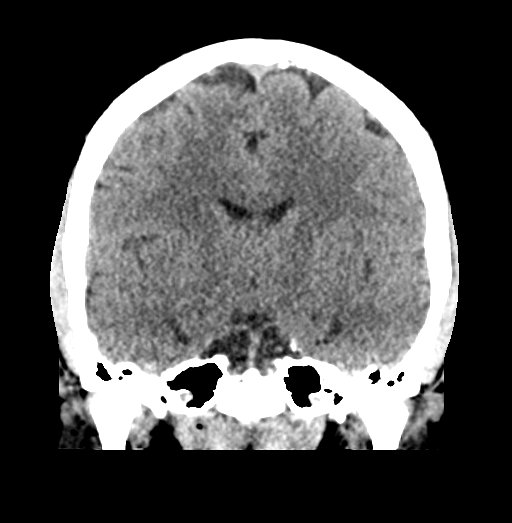

[Series 6: head venogram · axial · 0.43mm/px · z∈[-128,+12]mm · 15 of 82 slices shown]
[im 6/82  brain]
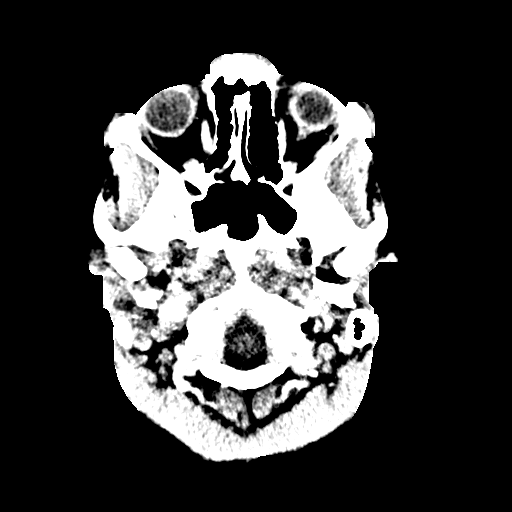
[im 11/82  bone]
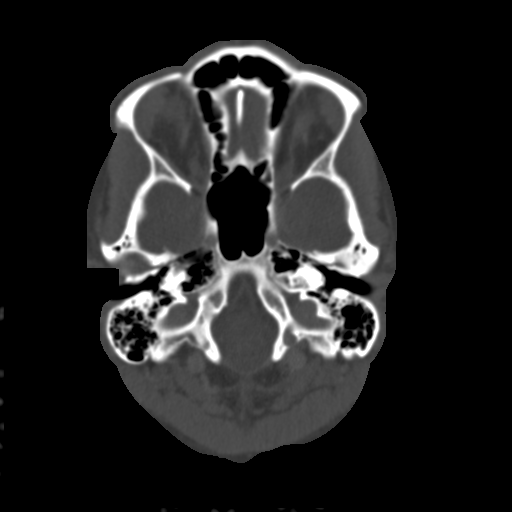
[im 16/82  brain]
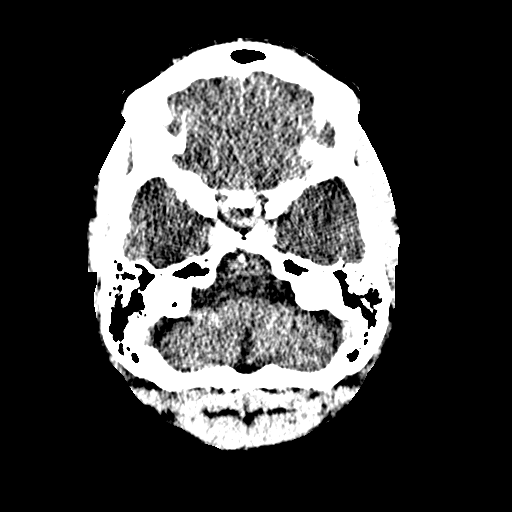
[im 21/82  bone]
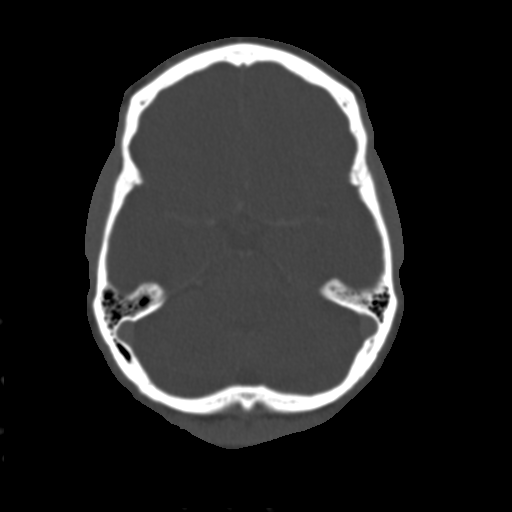
[im 26/82  brain]
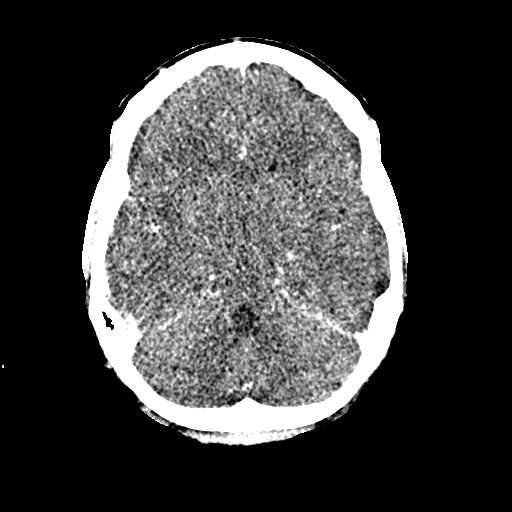
[im 31/82  bone]
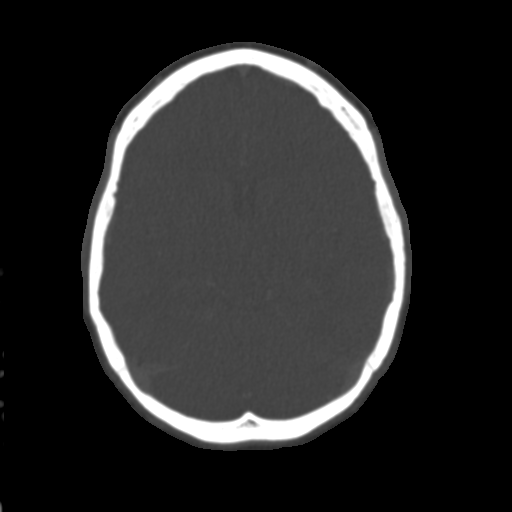
[im 36/82  brain]
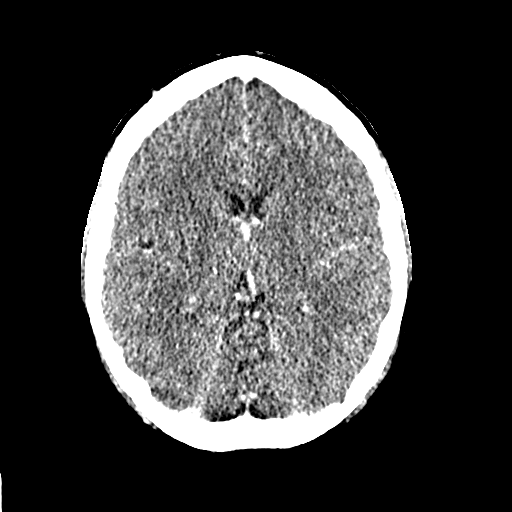
[im 41/82  bone]
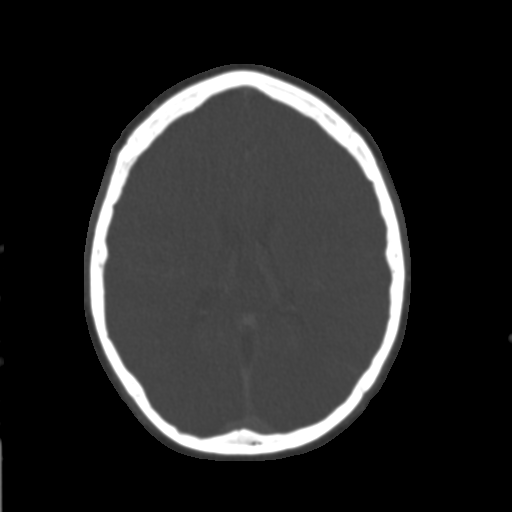
[im 46/82  brain]
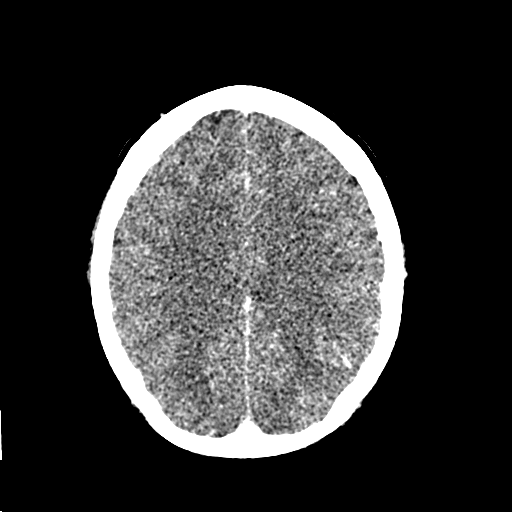
[im 51/82  bone]
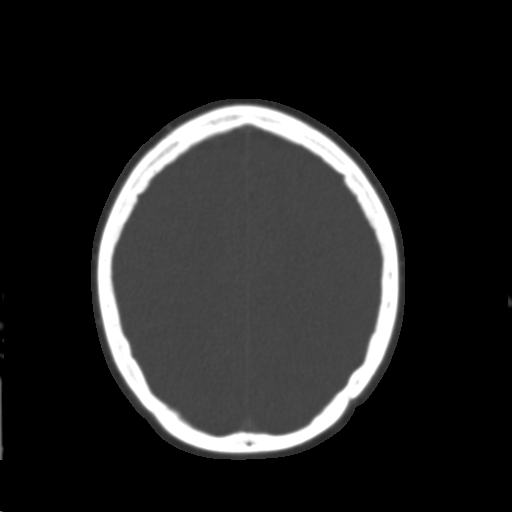
[im 56/82  brain]
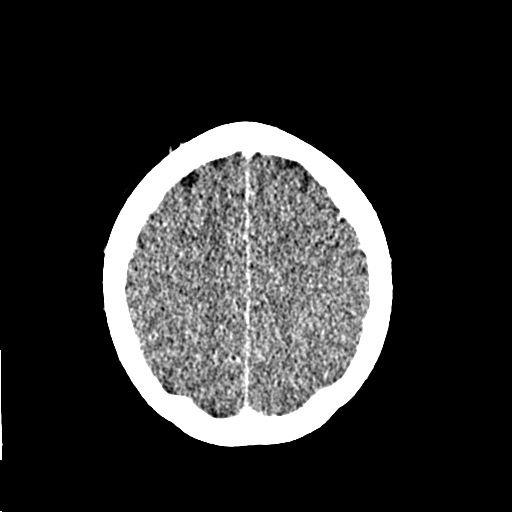
[im 61/82  bone]
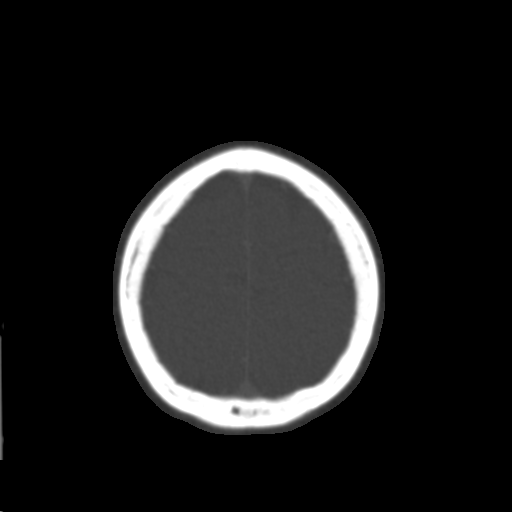
[im 66/82  brain]
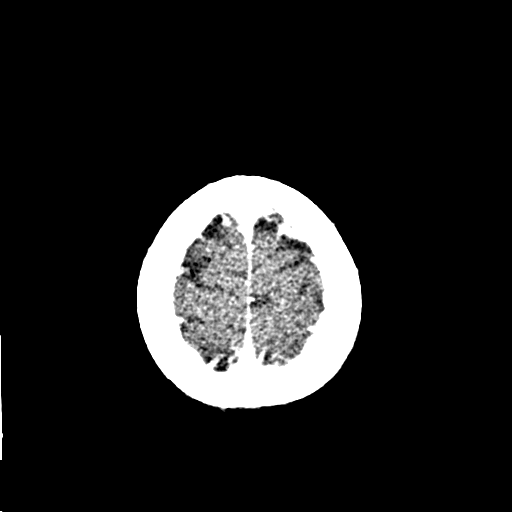
[im 71/82  bone]
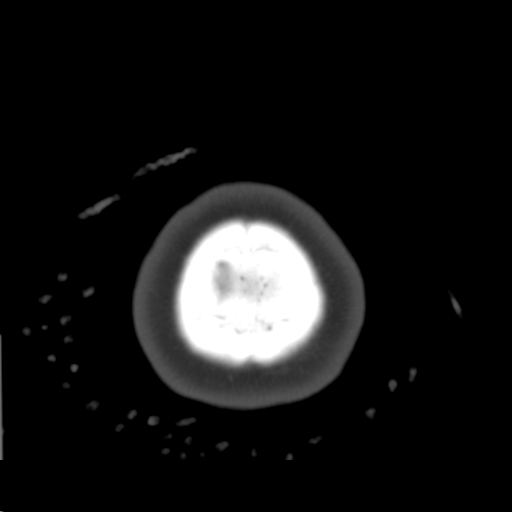
[im 76/82  brain]
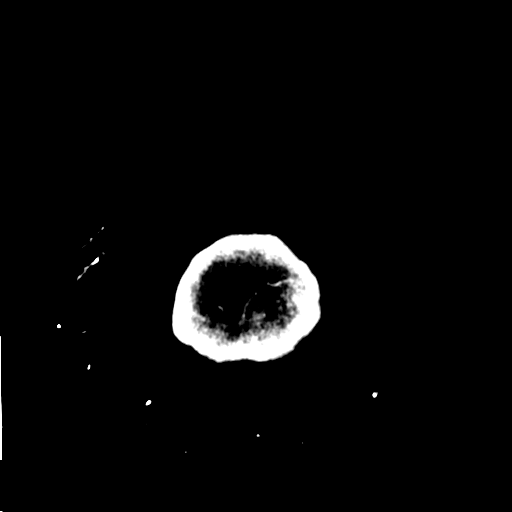

[16 of 47 positions shown; findings below may reference images not displayed]

FINDINGS: Brain: The brain has a normal appearance without evidence
malformation, atrophy, old or acute infarction, mass lesion,
hemorrhage, hydrocephalus or extra-axial collection.

Vascular: No abnormal vascular finding. Specifically, no evidence
venous thrombosis in any location.

Skull: Normal

Sinuses/Orbits: Old medial orbital blowout fracture on the right.
Few opacified ethmoid air cells. Some mucosal thickening of the
upper right maxillary sinus.

Other: None
IMPRESSION: Normal appearance of the brain itself.

No evidence venous thrombosis.

Old medial wall orbital blowout fracture. Few scattered opacified
ethmoid air cells. Mild mucosal thickening of the upper right
maxillary sinus, not completely evaluated.

## 2023-04-17 ENCOUNTER — Emergency Department (HOSPITAL_COMMUNITY)
Admission: EM | Admit: 2023-04-17 | Discharge: 2023-04-17 | Discharge status: Against Medical Advice | Payer: Medicaid Other | Attending: Emergency Medicine | Admitting: Emergency Medicine

## 2023-04-17 ENCOUNTER — Encounter (HOSPITAL_COMMUNITY): Payer: Self-pay

## 2023-04-17 ENCOUNTER — Other Ambulatory Visit: Payer: Self-pay

## 2023-04-17 DIAGNOSIS — M79605 Pain in left leg: Secondary | ICD-10-CM | POA: Insufficient documentation

## 2023-04-17 DIAGNOSIS — M545 Low back pain, unspecified: Secondary | ICD-10-CM | POA: Diagnosis not present

## 2023-04-17 DIAGNOSIS — Z5321 Procedure and treatment not carried out due to patient leaving prior to being seen by health care provider: Secondary | ICD-10-CM | POA: Diagnosis not present

## 2023-04-17 DIAGNOSIS — M79604 Pain in right leg: Secondary | ICD-10-CM | POA: Diagnosis not present

## 2023-04-17 DIAGNOSIS — R350 Frequency of micturition: Secondary | ICD-10-CM | POA: Insufficient documentation

## 2023-04-17 DIAGNOSIS — G43909 Migraine, unspecified, not intractable, without status migrainosus: Secondary | ICD-10-CM | POA: Insufficient documentation

## 2023-04-17 NOTE — ED Triage Notes (Addendum)
Patients legs feel heavy. She feels like she is about to catch a cramp. Lower back pain and a migraine and urinary frequency but not burning.

## 2023-04-17 NOTE — ED Provider Notes (Signed)
Patient eloped prior to my evaluation. I did not participate in the care of this patient.   Loetta Rough, MD 04/18/23 [phone]

## 2023-04-17 NOTE — ED Notes (Signed)
Pt was witnessed to have left the department by staff and has not returned.

## 2023-04-17 NOTE — ED Notes (Signed)
Pt resting in bed with no acute distress noted at this time.  

## 2023-07-16 ENCOUNTER — Emergency Department (HOSPITAL_COMMUNITY)
Admission: EM | Admit: 2023-07-16 | Discharge: 2023-07-16 | Disposition: A | Discharge status: Home / Self Care | Payer: Medicaid Other | Attending: Emergency Medicine | Admitting: Emergency Medicine

## 2023-07-16 ENCOUNTER — Emergency Department (HOSPITAL_COMMUNITY): Payer: Medicaid Other

## 2023-07-16 ENCOUNTER — Other Ambulatory Visit: Payer: Self-pay

## 2023-07-16 ENCOUNTER — Encounter (HOSPITAL_COMMUNITY): Payer: Self-pay | Admitting: Emergency Medicine

## 2023-07-16 DIAGNOSIS — N83291 Other ovarian cyst, right side: Secondary | ICD-10-CM | POA: Diagnosis not present

## 2023-07-16 DIAGNOSIS — R1030 Lower abdominal pain, unspecified: Secondary | ICD-10-CM | POA: Diagnosis present

## 2023-07-16 DIAGNOSIS — N83201 Unspecified ovarian cyst, right side: Secondary | ICD-10-CM

## 2023-07-16 LAB — HCG, SERUM, QUALITATIVE: Preg, Serum: NEGATIVE

## 2023-07-16 LAB — COMPREHENSIVE METABOLIC PANEL
ALT: 41 U/L (ref 0–44)
AST: 31 U/L (ref 15–41)
Albumin: 4.1 g/dL (ref 3.5–5.0)
Alkaline Phosphatase: 59 U/L (ref 38–126)
Anion gap: 9 (ref 5–15)
BUN: 13 mg/dL (ref 6–20)
CO2: 22 mmol/L (ref 22–32)
Calcium: 9 mg/dL (ref 8.9–10.3)
Chloride: 105 mmol/L (ref 98–111)
Creatinine, Ser: 0.91 mg/dL (ref 0.44–1.00)
GFR, Estimated: >60 mL/min (ref >60)
Glucose, Bld: 103 mg/dL — ABNORMAL HIGH (ref 70–99)
Potassium: 3.9 mmol/L (ref 3.5–5.1)
Sodium: 136 mmol/L (ref 135–145)
Total Bilirubin: 0.3 mg/dL (ref 0.3–1.2)
Total Protein: 7.7 g/dL (ref 6.5–8.1)

## 2023-07-16 LAB — CBC
HCT: 39.6 % (ref 36.0–46.0)
Hemoglobin: 12.7 g/dL (ref 12.0–15.0)
MCH: 26.6 pg (ref 26.0–34.0)
MCHC: 32.1 g/dL (ref 30.0–36.0)
MCV: 83 fL (ref 80.0–100.0)
Platelets: 138 10*3/uL — ABNORMAL LOW (ref 150–400)
RBC: 4.77 MIL/uL (ref 3.87–5.11)
RDW: 12.9 % (ref 11.5–15.5)
WBC: 8.5 10*3/uL (ref 4.0–10.5)
nRBC: 0 % (ref 0.0–0.2)

## 2023-07-16 LAB — URINALYSIS, ROUTINE W REFLEX MICROSCOPIC
Bilirubin Urine: NEGATIVE
Glucose, UA: NEGATIVE mg/dL
Hgb urine dipstick: NEGATIVE
Ketones, ur: NEGATIVE mg/dL
Leukocytes,Ua: NEGATIVE
Nitrite: NEGATIVE
Protein, ur: NEGATIVE mg/dL
Specific Gravity, Urine: 1.026 (ref 1.005–1.030)
pH: 5 (ref 5.0–8.0)

## 2023-07-16 LAB — LIPASE, BLOOD: Lipase: 36 U/L (ref 11–51)

## 2023-07-16 MED ORDER — KETOROLAC TROMETHAMINE 15 MG/ML IJ SOLN
15.0000 mg | Freq: Once | INTRAMUSCULAR | Status: AC
  Administered 2023-07-16: 15 mg via INTRAVENOUS
  Filled 2023-07-16: qty 1

## 2023-07-16 MED ORDER — MORPHINE SULFATE (PF) 4 MG/ML IV SOLN
4.0000 mg | Freq: Once | INTRAVENOUS | Status: AC
  Administered 2023-07-16: 4 mg via INTRAVENOUS
  Filled 2023-07-16: qty 1

## 2023-07-16 MED ORDER — IOHEXOL 300 MG/ML  SOLN
100.0000 mL | Freq: Once | INTRAMUSCULAR | Status: AC | PRN
  Administered 2023-07-16: 100 mL via INTRAVENOUS

## 2023-07-16 MED ORDER — SODIUM CHLORIDE (PF) 0.9 % IJ SOLN
INTRAMUSCULAR | Status: AC
  Filled 2023-07-16: qty 50

## 2023-07-16 NOTE — ED Provider Notes (Signed)
[place] EMERGENCY DEPARTMENT AT [facility] Provider Note   CSN: [id number] Arrival date & time: 07/16/23  0035     History  Chief Complaint  Patient presents with   Abdominal Pain    Patricia Valencia is a 37 y.o. female.  Patient complains of severe lower abdominal pain radiating across her lower abdomen and downwards towards her rectum and vagina that began after sexual intercourse earlier in the evening.  Patient states that she had sexual intercourse around 79 PM.  She states she had some pain post intercourse which was manageable but upon driving to work and going over a speed bump she began to have severe pain.  She denies any vaginal bleeding or discharge.  She states the intercourse was vaginal.  Patient denies nausea, vomiting, shortness of breath, chest pain.  Past medical history significant for thrombocytopenia  HPI     Home Medications Prior to Admission medications   Medication Sig Start Date End Date Taking? Authorizing Provider  amLODipine (NORVASC) 2.5 MG tablet Take 2.5 mg by mouth daily.   Yes [provider]  cyclobenzaprine (FLEXERIL) 10 MG tablet Take 10 mg by mouth 3 (three) times daily as needed for muscle spasms. 07/07/23  Yes [provider]  meloxicam (MOBIC) 15 MG tablet Take 15 mg by mouth daily as needed for pain. 07/07/23  Yes [provider]  Multiple Vitamin (MULTIVITAMIN) tablet Take 1 tablet by mouth daily.   Yes [provider]  vitamin D3 (CHOLECALCIFEROL) 25 MCG tablet Take 1,000 Units by mouth daily.   Yes [provider]  hydrOXYzine (ATARAX/VISTARIL) 25 MG tablet Take 1 tablet (25 mg total) by mouth every 8 (eight) hours as needed for anxiety. Start with half of a tablet to assess drowsiness and functioning. Do not drive after taking. Patient not taking: Reported on 07/16/2023 11/10/20   Marylene Land, CNM  ibuprofen (ADVIL) 600 MG tablet Take 1 tablet (600 mg total) by mouth  every 8 (eight) hours as needed. Patient not taking: Reported on 07/16/2023 05/15/22   Linwood Dibbles, MD  Norethindrone Acetate-Ethinyl Estrad-FE (LOESTRIN 24 FE) 1-20 MG-MCG(24) tablet Take 1 tablet by mouth daily. Patient not taking: Reported on 06/04/2021 12/12/20   Hermina Staggers, MD  penicillin v potassium (VEETID) 500 MG tablet Take 1 tablet (500 mg total) by mouth 3 (three) times daily. Patient not taking: Reported on 07/16/2023 05/15/22   Linwood Dibbles, MD      Allergies    Sulfa antibiotics    Review of Systems   Review of Systems  Physical Exam Updated Vital Signs BP (!) 135/90   Pulse 73   Temp 98.2 F (36.8 C) (Oral)   Resp 18   Wt 76.2 kg   LMP 06/30/2023 (Approximate) Comment: negative HCG qualitative 07/16/23  SpO2 98%   BMI 28.84 kg/m  Physical Exam Vitals and nursing note reviewed.  Constitutional:      General: She is not in acute distress.    Appearance: She is well-developed.  HENT:     Head: Normocephalic and atraumatic.  Eyes:     Conjunctiva/sclera: Conjunctivae normal.  Cardiovascular:     Rate and Rhythm: Normal rate and regular rhythm.     Heart sounds: No murmur heard. Pulmonary:     Effort: Pulmonary effort is normal. No respiratory distress.     Breath sounds: Normal breath sounds.  Abdominal:     Palpations: Abdomen is soft.     Tenderness: There is abdominal  tenderness in the right lower quadrant, suprapubic area and left lower quadrant.     Comments: Patient's pain appears to come in waves with generalized tenderness to palpation of the suprapubic region.  Musculoskeletal:        General: No swelling.     Cervical back: Neck supple.  Skin:    General: Skin is warm and dry.     Capillary Refill: Capillary refill takes less than 2 seconds.  Neurological:     Mental Status: She is alert.  Psychiatric:        Mood and Affect: Mood normal.     ED Results / Procedures / Treatments   Labs (all labs ordered are listed, but only abnormal  results are displayed) Labs Reviewed  COMPREHENSIVE METABOLIC PANEL - Abnormal; Notable for the following components:      Result Value   Glucose, Bld 103 (*)    All other components within normal limits  CBC - Abnormal; Notable for the following components:   Platelets 138 (*)    All other components within normal limits  LIPASE, BLOOD  URINALYSIS, ROUTINE W REFLEX MICROSCOPIC  HCG, SERUM, QUALITATIVE    EKG None  Radiology CT ABDOMEN PELVIS W CONTRAST  Result Date: 07/16/2023 CLINICAL DATA:  37 year old female with history of suprapubic pain radiating to the rectum and vagina. EXAM: CT ABDOMEN AND PELVIS WITH CONTRAST TECHNIQUE: Multidetector CT imaging of the abdomen and pelvis was performed using the standard protocol following bolus administration of intravenous contrast. RADIATION DOSE REDUCTION: This exam was performed according to the departmental dose-optimization program which includes automated exposure control, adjustment of the mA and/or kV according to patient size and/or use of iterative reconstruction technique. CONTRAST:  OMNIPAQUE IOHEXOL 300 MG/ML  SOLN COMPARISON:  No priors. FINDINGS: Lower chest: Small hiatal hernia. Hepatobiliary: No suspicious cystic or solid hepatic lesions. No intra or extrahepatic biliary ductal dilatation. Gallbladder is unremarkable in appearance. Pancreas: No pancreatic mass. No pancreatic ductal dilatation. No pancreatic or peripancreatic fluid collections or inflammatory changes. Spleen: Unremarkable. Adrenals/Urinary Tract: Bilateral kidneys and bilateral adrenal glands are normal in appearance. No hydroureteronephrosis. Urinary bladder is unremarkable in appearance. Stomach/Bowel: Normal appearance of the stomach. No pathologic dilatation of small bowel or colon. Normal appendix. Vascular/Lymphatic: Atherosclerosis in the pelvic vasculature, without evidence of aneurysm or dissection in the abdominal or pelvic vasculature. No  lymphadenopathy noted in the abdomen or pelvis. Reproductive: Uterus and left ovary are unremarkable in appearance. Rim enhancing structure in the right adnexal region measuring 2.8 cm in diameter, likely a degenerating corpus luteum cyst. Other: Small volume of intermediate attenuation free fluid in the cul-de-sac. No larger volume of ascites. No pneumoperitoneum. Musculoskeletal: There are no aggressive appearing lytic or blastic lesions noted in the visualized portions of the skeleton. IMPRESSION: 1. Small volume of intermediate attenuation free fluid in the cul-de-sac, likely physiologic in this young female patient. Probable degenerating corpus luteum cyst also noted in the right ovary. 2. No other potential acute findings are noted elsewhere in the abdomen or pelvis to account for the patient's symptoms. 3. Small hiatal hernia. 4. Atherosclerosis. Electronically Signed   By: Trudie Reed M.D.   On: 07/16/2023 06:03   US PELVIC COMPLETE W TRANSVAGINAL AND TORSION R/O  Result Date: 07/16/2023 CLINICAL DATA:  Initial evaluation for acute pelvic pain. EXAM: TRANSABDOMINAL AND TRANSVAGINAL ULTRASOUND OF PELVIS DOPPLER ULTRASOUND OF OVARIES TECHNIQUE: Both transabdominal and transvaginal ultrasound examinations of the pelvis were performed. Transabdominal technique was performed for global imaging  of the pelvis including uterus, ovaries, adnexal regions, and pelvic cul-de-sac. It was necessary to proceed with endovaginal exam following the transabdominal exam to visualize the endometrium. Color and duplex Doppler ultrasound was utilized to evaluate blood flow to the ovaries. COMPARISON:  None Available. FINDINGS: Uterus Measurements: 11.3 x 4.9 x 7.4 cm = volume: 213.1 mL. Uterus is anteverted. 1.1 x 1.0 x 1.0 cm partially calcified intramural fibroid present at the posterior uterine body. Endometrium Thickness: 10 mm.  No focal abnormality visualized. Right ovary Measurements: 4.4 x 2.9 x 3.3 cm = volume:  21.8 mL. 3.1 x 2.0 x 2.6 cm complex hypoechoic cyst with internal reticular echogenicity, most characteristic of a small hemorrhagic cyst. No internal vascularity or solid component. Left ovary Not visualized.  No left adnexal mass. Pulsed Doppler evaluation of the right ovary demonstrates normal low-resistance arterial and venous waveforms. Other findings Small volume free fluid present within the pelvis. IMPRESSION: 1. 3.1 cm complex right ovarian cyst, most characteristic of a hemorrhagic cyst. Associated small volume free fluid within the pelvis suggestive of recent cyst rupture. 2. No evidence for right ovarian torsion. 3. Nonvisualization of the left ovary. No other adnexal mass. 4. 1.1 cm intramural fibroid at the posterior uterine body. Electronically Signed   By: Rise Mu M.D.   On: 07/16/2023 03:18    Procedures Procedures    Medications Ordered in ED Medications  morphine (PF) 4 MG/ML injection 4 mg (4 mg Intravenous Given 07/16/23 0228)  iohexol (OMNIPAQUE) 300 MG/ML solution 100 mL (100 mLs Intravenous Contrast Given 07/16/23 0503)  ketorolac (TORADOL) 15 MG/ML injection 15 mg (15 mg Intravenous Given 07/16/23 0530)    ED Course/ Medical Decision Making/ A&P                             Medical Decision Making Amount and/or Complexity of Data Reviewed Labs: ordered. Radiology: ordered.  Risk Prescription drug management.   This patient presents to the ED for concern of pelvic pain, this involves an extensive number of treatment options, and is a complaint that carries with it a high risk of complications and morbidity.  The differential diagnosis includes ovarian torsion, ruptured ovarian cyst, ectopic pregnancy, others   Co morbidities that complicate the patient evaluation  Thrombocytopenia   Lab Tests:  I Ordered, and personally interpreted labs.  The pertinent results include: Negative pregnancy test, grossly unremarkable CBC, CMP, UA, and  lipase   Imaging Studies ordered:  I ordered imaging studies including pelvic ultrasound including transvaginal probe and torsion rule out I independently visualized and interpreted imaging which showed  1. 3.1 cm complex right ovarian cyst, most characteristic of a  hemorrhagic cyst. Associated small volume free fluid within the  pelvis suggestive of recent cyst rupture.  2. No evidence for right ovarian torsion.  3. Nonvisualization of the left ovary. No other adnexal mass.  4. 1.1 cm intramural fibroid at the posterior uterine body.  I also ordered and interpreted CT abdomen pelvis with contrast to evaluate for other abnormalities such as possible appendicitis. 1. Small volume of intermediate attenuation free fluid in the  cul-de-sac, likely physiologic in this young female patient.  Probable degenerating corpus luteum cyst also noted in the right  ovary.  2. No other potential acute findings are noted elsewhere in the  abdomen or pelvis to account for the patient's symptoms.  3. Small hiatal hernia.  4. Atherosclerosis.   I agree with the  radiologist interpretation    Problem List / ED Course / Critical interventions / Medication management  I ordered medication including morphine and Toradol for pain Reevaluation of the patient after these medicines showed that the patient improved I have reviewed the patients home medicines and have made adjustments as needed   Social Determinants of Health:  She has Medicaid for her primary health insurance type   Test / Admission - Considered:  Patient with symptoms consistent with a ruptured ovarian cyst, confirmed on both ultrasound and CT.  Patient feels much better after medication here in the emergency department.  She currently has a prescription for meloxicam.  Since she is taking the meloxicam I do not feel she should take other anti-inflammatories on top of the meloxicam.  Patient may take Tylenol in addition to the meloxicam  for her pain/inflammation.  Patient instructed to follow-up with OB/GYN.  Return precautions provided.        Final Clinical Impression(s) / ED Diagnoses Final diagnoses:  Hemorrhagic cyst of right ovary    Rx / DC Orders ED Discharge Orders     None         Pamala Duffel 07/16/23 0617    Glynn Octave, MD 07/16/23 [phone]

## 2023-07-16 NOTE — Discharge Instructions (Addendum)
You were diagnosed tonight with a hemorrhagic cyst.  This is likely causing your pain.  Please take your meloxicam and Tylenol for inflammation and pain control.  If you develop any life-threatening symptoms or intractable pain please return to the emergency department.  Otherwise please follow-up with OB/GYN.

## 2023-07-16 NOTE — ED Triage Notes (Signed)
Pt reports lower abdominal pain that radiates to rectum and vagina. Pt reports sexual intercourse tonight and reports pain and cramping that has gotten worse since then. Pt denies vaginal bleeding and reports LMP 7/8.

## 2023-07-16 NOTE — ED Notes (Signed)
Sent urine with a culture

## 2024-03-09 ENCOUNTER — Ambulatory Visit
Admission: RE | Admit: 2024-03-09 | Discharge: 2024-03-09 | Disposition: A | Discharge status: Home / Self Care | Payer: Self-pay | Source: Ambulatory Visit | Attending: Family Medicine | Admitting: Family Medicine

## 2024-03-09 ENCOUNTER — Other Ambulatory Visit: Payer: Self-pay

## 2024-03-09 VITALS — BP 144/85 | HR 84 | Temp 98.1°F | Resp 18 | Ht 64.0 in | Wt 164.0 lb

## 2024-03-09 DIAGNOSIS — R350 Frequency of micturition: Secondary | ICD-10-CM | POA: Insufficient documentation

## 2024-03-09 DIAGNOSIS — Z113 Encounter for screening for infections with a predominantly sexual mode of transmission: Secondary | ICD-10-CM | POA: Insufficient documentation

## 2024-03-09 LAB — POCT URINE PREGNANCY: Preg Test, Ur: NEGATIVE

## 2024-03-09 LAB — POCT URINALYSIS DIP (MANUAL ENTRY)
Bilirubin, UA: NEGATIVE
Blood, UA: NEGATIVE
Glucose, UA: NEGATIVE mg/dL
Ketones, POC UA: NEGATIVE mg/dL
Nitrite, UA: NEGATIVE
Protein Ur, POC: NEGATIVE mg/dL
Spec Grav, UA: 1.025
Urobilinogen, UA: 0.2 U/dL
pH, UA: 6

## 2024-03-09 NOTE — ED Provider Notes (Signed)
 [facility]    CSN: [id number] Arrival date & time: 03/09/24  0906      History   Chief Complaint Chief Complaint  Patient presents with   SEXUALLY TRANSMITTED DISEASE    STD check - Entered by patient    HPI Patricia Valencia is a 38 y.o. female.   HPI Had lower abdominal cramping 2 days ago which lasted several hours now resolved.  She would like to have testing for STDs.  Admits urinary frequency.  Did notice some vaginal odor after her last menstrual period. denies dysuria, urgency, hematuria, malodorous urine, cloudy urine, vaginal discharge, genital rashes or skin changes, fever, chills, sweats, nausea, vomiting.  LMP 02/10/2024.  Actually active with 1 partner.  Allergy to sulfa.  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/09/2021   History of preterm delivery 10/19/2020   Depressed mood 10/19/2020   Thrombocytopenia (HCC) 09/29/2020   History of pre-eclampsia 05/29/2020    Past Surgical History:  Procedure Laterality Date   CERVICAL CERCLAGE  2021   HAMMER TOE SURGERY Right 03/16/2021   Procedure: HAMMER TOE CORRECTION SECOND TOE RIGHT,AND HALLUX MALLEUS RIGHT  FOOT;  Surgeon: Edwin Cap, DPM;  Location: [facility];  Service: Podiatry;  Laterality: Right;    OB History     Gravida  5   Para  4   Term  1   Preterm  3   AB  1   Living  4      SAB  0   IAB  1   Ectopic  0   Multiple  0   Live Births  4            Home  Medications    Prior to Admission medications   Medication Sig Start Date End Date Taking? Authorizing Provider  amLODipine (NORVASC) 2.5 MG tablet Take 2.5 mg by mouth daily.    [provider]  cyclobenzaprine (FLEXERIL) 10 MG tablet Take 10 mg by mouth 3 (three) times daily as needed for muscle spasms. 07/07/23   [provider]  hydrOXYzine (ATARAX/VISTARIL) 25 MG tablet Take 1 tablet (25 mg total) by mouth every 8 (eight) hours as needed for anxiety. Start with half of a tablet to assess drowsiness and functioning. Do not drive after taking. Patient not taking: Reported on 07/16/2023 11/10/20   Marylene Land, CNM  ibuprofen (ADVIL) 600 MG tablet Take 1 tablet (600 mg total) by mouth every 8 (eight) hours as needed. Patient not taking: Reported on 07/16/2023 05/15/22   Linwood Dibbles, MD  meloxicam (MOBIC) 15 MG tablet Take 15 mg by mouth daily as needed for pain. 07/07/23   [provider]  Multiple Vitamin (MULTIVITAMIN) tablet Take 1 tablet by mouth daily.    [provider]  Norethindrone Acetate-Ethinyl Estrad-FE (LOESTRIN 24 FE) 1-20 MG-MCG(24) tablet Take 1 tablet by mouth daily. Patient not taking: Reported on 06/04/2021  12/12/20   Hermina Staggers, MD  penicillin v potassium (VEETID) 500 MG tablet Take 1 tablet (500 mg total) by mouth 3 (three) times daily. Patient not taking: Reported on 07/16/2023 05/15/22   Linwood Dibbles, MD  vitamin D3 (CHOLECALCIFEROL) 25 MCG tablet Take 1,000 Units by mouth daily.    [provider]    Family History Family History  Problem Relation Age of Onset   Hypertension Mother    Hypertension Father     Social History Social History   Tobacco Use   Smoking status: Never   Smokeless tobacco: Never  Vaping Use   Vaping status: Never Used  Substance Use Topics   Alcohol use: Not Currently    Comment: occasional   Drug use: Never     Allergies   Sulfa antibiotics   Review of Systems Review  of Systems   Physical Exam Triage Vital Signs ED Triage Vitals  Encounter Vitals Group     BP 03/09/24 0912 (!) 144/85     Systolic BP Percentile --      Diastolic BP Percentile --      Pulse Rate 03/09/24 0912 84     Resp 03/09/24 0912 18     Temp 03/09/24 0912 98.1 F (36.7 C)     Temp Source 03/09/24 0912 Oral     SpO2 03/09/24 0912 94 %     Weight 03/09/24 0912 164 lb (74.4 kg)     Height 03/09/24 0912 5\' 4"  (1.626 m)     Head Circumference --      Peak Flow --      Pain Score 03/09/24 0914 0     Pain Loc --      Pain Education --      Exclude from Growth Chart --    No data found.  Updated Vital Signs BP (!) 144/85 (BP Location: Right Arm) Comment: Did no take BP meds this morning  Pulse 84   Temp 98.1 F (36.7 C) (Oral)   Resp 18   Ht 5\' 4"  (1.626 m)   Wt 164 lb (74.4 kg)   LMP 02/10/2024 (Approximate)   SpO2 94%   BMI 28.15 kg/m   Visual Acuity Right Eye Distance:   Left Eye Distance:   Bilateral Distance:    Right Eye Near:   Left Eye Near:    Bilateral Near:     Physical Exam Constitutional:      Appearance: She is not ill-appearing.  HENT:     Head: Normocephalic.  Cardiovascular:     Rate and Rhythm: Normal rate and regular rhythm.     Heart sounds: Normal heart sounds.  Pulmonary:     Effort: Pulmonary effort is normal.     Breath sounds: Normal breath sounds.  Abdominal:     General: Bowel sounds are normal.     Palpations: Abdomen is soft.     Tenderness: There is no abdominal tenderness. There is no right CVA tenderness, left CVA tenderness or guarding.  Skin:    General: Skin is warm and dry.  Neurological:     Mental Status: She is alert and oriented to person, place, and time.      UC Treatments / Results  Labs (all labs ordered are listed, but only abnormal results are displayed) Labs Reviewed  POCT URINALYSIS DIP (MANUAL ENTRY) - Abnormal; Notable for the following components:      Result Value   Leukocytes, UA Trace  (*)    All  other components within normal limits  HIV ANTIBODY (ROUTINE TESTING W REFLEX)  RPR  POCT URINE PREGNANCY  CERVICOVAGINAL ANCILLARY ONLY    EKG   Radiology No results found.  Procedures Procedures (including critical care time)  Medications Ordered in UC Medications - No data to display  Initial Impression / Assessment and Plan / UC Course  I have reviewed the triage vital signs and the nursing notes.  Pertinent labs & imaging results that were available during my care of the patient were reviewed by me and considered in my medical decision making (see chart for details).     38 year old female with urinary frequency requesting STI testing.  Point-of-care urine pregnancy negative, point-of-care urine dipstick trace leukocytes otherwise negative no nitrates ketones or blood Test results will be released to patient's MyChart account we will contact her if anything is positive and requires treatment Final Clinical Impressions(s) / UC Diagnoses   Final diagnoses:  Screen for STD (sexually transmitted disease)  Urinary frequency     Discharge Instructions      Test results will be released to your MyChart account We will contact you if anything is positive and requires treatment.    ED Prescriptions   None    PDMP not reviewed this encounter.   Meliton Rattan, [name] 03/09/24 [phone]

## 2024-03-09 NOTE — Discharge Instructions (Signed)
 Test results will be released to your MyChart account We will contact you if anything is positive and requires treatment.

## 2024-03-09 NOTE — ED Triage Notes (Addendum)
 Pt presents for STD testing. She wants to have blood work done too States she has had abdominal pain for 2 days

## 2024-03-10 LAB — CERVICOVAGINAL ANCILLARY ONLY
Bacterial Vaginitis (gardnerella): POSITIVE — AB
Candida Glabrata: NEGATIVE
Candida Vaginitis: NEGATIVE
Chlamydia: NEGATIVE
Comment: NEGATIVE
Comment: NEGATIVE
Comment: NEGATIVE
Comment: NEGATIVE
Comment: NEGATIVE
Comment: NORMAL
Neisseria Gonorrhea: NEGATIVE
Trichomonas: NEGATIVE

## 2024-03-10 LAB — RPR: RPR Ser Ql: NONREACTIVE

## 2024-03-10 LAB — HIV ANTIBODY (ROUTINE TESTING W REFLEX): HIV Screen 4th Generation wRfx: NONREACTIVE

## 2024-03-11 ENCOUNTER — Telehealth: Payer: Self-pay | Admitting: Emergency Medicine

## 2024-03-11 ENCOUNTER — Telehealth: Payer: Self-pay

## 2024-03-11 DIAGNOSIS — N760 Acute vaginitis: Secondary | ICD-10-CM

## 2024-03-11 MED ORDER — METRONIDAZOLE 500 MG PO TABS: 500.0000 mg | ORAL_TABLET | Freq: Two times a day (BID) | ORAL | 0 refills | Status: DC

## 2024-03-11 MED ORDER — METRONIDAZOLE 500 MG PO TABS: 500.0000 mg | ORAL_TABLET | Freq: Two times a day (BID) | ORAL | 0 refills | Status: AC

## 2024-03-11 NOTE — Telephone Encounter (Signed)
 Patient tested positive for bacterial vaginosis.  Treatment with metronidazole 500 mg twice daily for 7 days recommended.  Sent to preferred pharmacy

## 2025-01-02 ENCOUNTER — Emergency Department (HOSPITAL_COMMUNITY)
Admission: EM | Admit: 2025-01-02 | Discharge: 2025-01-02 | Discharge status: Against Medical Advice | Payer: Self-pay | Attending: Emergency Medicine | Admitting: Emergency Medicine

## 2025-01-02 DIAGNOSIS — Z5321 Procedure and treatment not carried out due to patient leaving prior to being seen by health care provider: Secondary | ICD-10-CM | POA: Insufficient documentation

## 2025-01-02 DIAGNOSIS — R519 Headache, unspecified: Secondary | ICD-10-CM | POA: Insufficient documentation

## 2025-01-02 DIAGNOSIS — R350 Frequency of micturition: Secondary | ICD-10-CM | POA: Insufficient documentation

## 2025-01-02 DIAGNOSIS — R5383 Other fatigue: Secondary | ICD-10-CM | POA: Insufficient documentation

## 2025-01-02 LAB — CBC WITH DIFFERENTIAL/PLATELET
Abs Immature Granulocytes: 0.01 K/uL (ref 0.00–0.07)
Basophils Absolute: 0.1 K/uL (ref 0.0–0.1)
Basophils Relative: 1 %
Eosinophils Absolute: 0.2 K/uL (ref 0.0–0.5)
Eosinophils Relative: 3 %
HCT: 37.3 % (ref 36.0–46.0)
Hemoglobin: 11.7 g/dL — ABNORMAL LOW (ref 12.0–15.0)
Immature Granulocytes: 0 %
Lymphocytes Relative: 33 %
Lymphs Abs: 2 K/uL (ref 0.7–4.0)
MCH: 25.2 pg — ABNORMAL LOW (ref 26.0–34.0)
MCHC: 31.4 g/dL (ref 30.0–36.0)
MCV: 80.2 fL (ref 80.0–100.0)
Monocytes Absolute: 0.5 K/uL (ref 0.1–1.0)
Monocytes Relative: 9 %
Neutro Abs: 3.3 K/uL (ref 1.7–7.7)
Neutrophils Relative %: 54 %
Platelets: 125 K/uL — ABNORMAL LOW (ref 150–400)
RBC: 4.65 MIL/uL (ref 3.87–5.11)
RDW: 14.6 % (ref 11.5–15.5)
WBC: 6.1 K/uL (ref 4.0–10.5)
nRBC: 0 % (ref 0.0–0.2)

## 2025-01-02 LAB — HCG, SERUM, QUALITATIVE: Preg, Serum: NEGATIVE

## 2025-01-02 LAB — COMPREHENSIVE METABOLIC PANEL WITH GFR
ALT: 42 U/L (ref 0–44)
AST: 29 U/L (ref 15–41)
Albumin: 4 g/dL (ref 3.5–5.0)
Alkaline Phosphatase: 70 U/L (ref 38–126)
Anion gap: 9 (ref 5–15)
BUN: 11 mg/dL (ref 6–20)
CO2: 25 mmol/L (ref 22–32)
Calcium: 8.9 mg/dL (ref 8.9–10.3)
Chloride: 105 mmol/L (ref 98–111)
Creatinine, Ser: 0.89 mg/dL (ref 0.44–1.00)
GFR, Estimated: >60 mL/min
Glucose, Bld: 92 mg/dL (ref 70–99)
Potassium: 3.9 mmol/L (ref 3.5–5.1)
Sodium: 138 mmol/L (ref 135–145)
Total Bilirubin: 0.3 mg/dL (ref 0.0–1.2)
Total Protein: 6.9 g/dL (ref 6.5–8.1)

## 2025-01-02 LAB — URINALYSIS, ROUTINE W REFLEX MICROSCOPIC
Bilirubin Urine: NEGATIVE
Glucose, UA: NEGATIVE mg/dL
Ketones, ur: NEGATIVE mg/dL
Nitrite: NEGATIVE
Protein, ur: NEGATIVE mg/dL
Specific Gravity, Urine: 1.015 (ref 1.005–1.030)
pH: 5 (ref 5.0–8.0)

## 2025-01-02 LAB — PRO BRAIN NATRIURETIC PEPTIDE: Pro Brain Natriuretic Peptide: <50 pg/mL

## 2025-01-02 NOTE — ED Triage Notes (Signed)
 Patient reports fatigue for weeks. Denies fever/cough/chills. Patient is alert and oriented x 4. Airway patent, respirations even and unlabored. Skin normal, warm and dry.

## 2025-01-02 NOTE — ED Provider Triage Note (Cosign Needed Addendum)
 Emergency Medicine Provider Triage Evaluation Note  Patricia Valencia , a 39 y.o. female  was evaluated in triage.  Pt complains of fatigue and frequent urination. States also some headaches.  No fever, chills, nausea or vomiting.   No hx of anemia. Does have heavy menstrual bleeding but no changes lately.   Review of Systems  Positive: fatigue Negative: Fever   Physical Exam  BP (!) 137/99 (BP Location: Left Arm)   Pulse 68   Temp 98 F (36.7 C)   Resp 20   LMP 12/23/2024   SpO2 99%  Gen:   Awake, no distress   Resp:  Normal effort  MSK:   Moves extremities without difficulty  Other:    Medical Decision Making  Medically screening exam initiated at 1:27 PM.  Appropriate orders placed.  Patricia Valencia was informed that the remainder of the evaluation will be completed by another provider, this initial triage assessment does not replace that evaluation, and the importance of remaining in the ED until their evaluation is complete.  Labs ecg    Patricia Valencia, Patricia Valencia 01/02/25 1328    [facility], [facility] 01/02/25 1329

## 2025-01-06 ENCOUNTER — Emergency Department (HOSPITAL_COMMUNITY)
Admission: EM | Admit: 2025-01-06 | Discharge: 2025-01-06 | Discharge status: Against Medical Advice | Payer: Self-pay | Attending: Emergency Medicine | Admitting: Emergency Medicine

## 2025-01-06 DIAGNOSIS — D649 Anemia, unspecified: Secondary | ICD-10-CM | POA: Insufficient documentation

## 2025-01-06 DIAGNOSIS — Z5329 Procedure and treatment not carried out because of patient's decision for other reasons: Secondary | ICD-10-CM | POA: Insufficient documentation

## 2025-01-06 DIAGNOSIS — R350 Frequency of micturition: Secondary | ICD-10-CM | POA: Insufficient documentation

## 2025-01-06 DIAGNOSIS — R5383 Other fatigue: Secondary | ICD-10-CM | POA: Insufficient documentation

## 2025-01-06 LAB — URINALYSIS, W/ REFLEX TO CULTURE (INFECTION SUSPECTED)
Bilirubin Urine: NEGATIVE
Glucose, UA: NEGATIVE mg/dL
Hgb urine dipstick: NEGATIVE
Ketones, ur: NEGATIVE mg/dL
Leukocytes,Ua: NEGATIVE
Nitrite: NEGATIVE
Protein, ur: NEGATIVE mg/dL
Specific Gravity, Urine: 1.019 (ref 1.005–1.030)
pH: 7 (ref 5.0–8.0)

## 2025-01-06 LAB — PREGNANCY, URINE: Preg Test, Ur: NEGATIVE

## 2025-01-06 NOTE — ED Notes (Signed)
Called x 1 for rooming

## 2025-01-06 NOTE — ED Provider Notes (Signed)
 " [facility] EMERGENCY DEPARTMENT AT [facility] Provider Note   CSN: [id number] Arrival date & time: 01/06/25  1028     Patient presents with: Abnormal Lab   Patricia Valencia is a 39 y.o. female.   Patient is a 39 year old who presents with concerns over abnormal blood work.  She was seen here on January 11 for fatigue.  She said she has been fatigued for about a week.  She has been sleeping more than normal.  No runny nose coughing or congestion.  No abdominal pain.  No vomiting or diarrhea.  She has had some increased urination but no burning on urination.  She had labs done on her visit on January 11 and was concerned about some abnormal results.  She is still having the fatigue but it has not gotten any worse or any better.  She still has a little bit of urinary frequency.       Prior to Admission medications  Medication Sig Start Date End Date Taking? Authorizing Provider  amLODipine  (NORVASC ) 2.5 MG tablet Take 2.5 mg by mouth daily.    [provider]  cyclobenzaprine (FLEXERIL) 10 MG tablet Take 10 mg by mouth 3 (three) times daily as needed for muscle spasms. 07/07/23   [provider]  hydrOXYzine  (ATARAX /VISTARIL ) 25 MG tablet Take 1 tablet (25 mg total) by mouth every 8 (eight) hours as needed for anxiety. Start with half of a tablet to assess drowsiness and functioning. Do not drive after taking. Patient not taking: Reported on 07/16/2023 11/10/20   Patricia, Kathryn Valencia, CNM  ibuprofen  (ADVIL ) 600 MG tablet Take 1 tablet (600 mg total) by mouth every 8 (eight) hours as needed. Patient not taking: Reported on 07/16/2023 05/15/22   Patricia Simmonds, MD  meloxicam (MOBIC) 15 MG tablet Take 15 mg by mouth daily as needed for pain. 07/07/23   [provider]  metroNIDAZOLE  (FLAGYL ) 500 MG tablet Take 1 tablet (500 mg total) by mouth 2 (two) times daily. 03/11/24   Reddick, Johnathan B, NP  Multiple Vitamin (MULTIVITAMIN) tablet Take 1 tablet by  mouth daily.    [provider]  Norethindrone Acetate-Ethinyl Estrad-FE (LOESTRIN 24 FE) 1-20 MG-MCG(24) tablet Take 1 tablet by mouth daily. Patient not taking: Reported on 06/04/2021 12/12/20   Patricia, Michael L, MD  penicillin  v potassium (VEETID) 500 MG tablet Take 1 tablet (500 mg total) by mouth 3 (three) times daily. Patient not taking: Reported on 07/16/2023 05/15/22   Patricia Simmonds, MD  vitamin D3 (CHOLECALCIFEROL) 25 MCG tablet Take 1,000 Units by mouth daily.    [provider]    Allergies: Sulfa antibiotics    Review of Systems  Constitutional:  Positive for fatigue. Negative for chills, diaphoresis and fever.  HENT:  Negative for congestion, rhinorrhea and sneezing.   Eyes: Negative.   Respiratory:  Negative for cough, chest tightness and shortness of breath.   Cardiovascular:  Negative for chest pain and leg swelling.  Gastrointestinal:  Negative for abdominal pain, diarrhea, nausea and vomiting.  Genitourinary:  Positive for frequency. Negative for difficulty urinating, flank pain and hematuria.  Musculoskeletal:  Negative for arthralgias and back pain.  Skin:  Negative for rash.  Neurological:  Negative for dizziness, speech difficulty, weakness, numbness and headaches.    Updated Vital Signs BP (!) 147/92 (BP Location: Left Arm)   Pulse 77   Temp 98.8 F (37.1 C) (Oral)   Resp 16   LMP 12/23/2024   SpO2 96%  Physical Exam Constitutional:      Appearance: She is well-developed.  HENT:     Head: Normocephalic and atraumatic.  Eyes:     Pupils: Pupils are equal, round, and reactive to light.  Cardiovascular:     Rate and Rhythm: Normal rate and regular rhythm.     Heart sounds: Normal heart sounds.  Pulmonary:     Effort: Pulmonary effort is normal. No respiratory distress.     Breath sounds: Normal breath sounds. No wheezing or rales.  Chest:     Chest wall: No tenderness.  Abdominal:     General: Bowel sounds are normal.     Palpations:  Abdomen is soft.     Tenderness: There is no abdominal tenderness. There is no guarding or rebound.  Musculoskeletal:        General: Normal range of motion.     Cervical back: Normal range of motion and neck supple.  Lymphadenopathy:     Cervical: No cervical adenopathy.  Skin:    General: Skin is warm and dry.     Findings: No rash.  Neurological:     General: No focal deficit present.     Mental Status: She is alert and oriented to person, place, and time.     (all labs ordered are listed, but only abnormal results are displayed) Labs Reviewed  URINALYSIS, W/ REFLEX TO CULTURE (INFECTION SUSPECTED) - Abnormal; Notable for the following components:      Result Value   Bacteria, UA RARE (*)    All other components within normal limits  PREGNANCY, URINE    EKG: None  Radiology: No results found.   Procedures   Medications Ordered in the ED - No data to display                                  Medical Decision Making Amount and/or Complexity of Data Reviewed Labs: ordered.   Patient presents with fatigue and urinary frequency.  She wanted to go over her labs which were done 4 days ago in the ED.  Her labs are nonconcerning.  Her hemoglobin is marginally low but doubt this would be contributing to her symptoms.  Her urine appears to be more consistent with a dirty specimen.  This was rechecked today and it does not look consistent with a UTI.  She eloped prior to me discussing the results with her.     Final diagnoses:  Fatigue, unspecified type    ED Discharge Orders     None          Lenor Hollering, MD 01/06/25 1254  "

## 2025-01-06 NOTE — ED Triage Notes (Signed)
 Pt reports seen here on 1/11 had to leave early prior to seeing a provider, pt reports that she noticed lab work has resulted and wanted a provider to review them with her
